# Patient Record
Sex: Female | Born: 1955 | Race: White | Hispanic: No | Marital: Married | Smoking: Never smoker
Health system: Southern US, Community
[De-identification: ages and names within clinical notes are randomized; demographics above are authoritative.]

## PROBLEM LIST (undated history)

## (undated) DIAGNOSIS — T8859XA Other complications of anesthesia, initial encounter: Secondary | ICD-10-CM

## (undated) DIAGNOSIS — Z9889 Other specified postprocedural states: Secondary | ICD-10-CM

---

## 1999-11-21 ENCOUNTER — Other Ambulatory Visit: Admission: RE | Admit: 1999-11-21 | Discharge: 1999-11-21 | Payer: Self-pay | Admitting: Family Medicine

## 2000-02-15 ENCOUNTER — Ambulatory Visit (HOSPITAL_COMMUNITY): Admission: RE | Admit: 2000-02-15 | Discharge: 2000-02-15 | Payer: Self-pay | Admitting: Gastroenterology

## 2000-05-07 ENCOUNTER — Ambulatory Visit (HOSPITAL_COMMUNITY): Admission: RE | Admit: 2000-05-07 | Discharge: 2000-05-07 | Payer: Self-pay | Admitting: Family Medicine

## 2000-12-05 ENCOUNTER — Other Ambulatory Visit: Admission: RE | Admit: 2000-12-05 | Discharge: 2000-12-05 | Payer: Self-pay | Admitting: Family Medicine

## 2002-03-19 ENCOUNTER — Other Ambulatory Visit: Admission: RE | Admit: 2002-03-19 | Discharge: 2002-03-19 | Payer: Self-pay | Admitting: Family Medicine

## 2003-04-17 HISTORY — PX: LASIK: SHX215

## 2003-04-21 ENCOUNTER — Other Ambulatory Visit: Admission: RE | Admit: 2003-04-21 | Discharge: 2003-04-21 | Payer: Self-pay | Admitting: Family Medicine

## 2005-06-22 ENCOUNTER — Ambulatory Visit (HOSPITAL_COMMUNITY): Admission: RE | Admit: 2005-06-22 | Discharge: 2005-06-22 | Payer: Self-pay | Admitting: Gastroenterology

## 2008-06-16 ENCOUNTER — Encounter: Payer: Self-pay | Admitting: Gynecology

## 2008-06-16 ENCOUNTER — Other Ambulatory Visit: Admission: RE | Admit: 2008-06-16 | Discharge: 2008-06-16 | Payer: Self-pay | Admitting: Gynecology

## 2008-06-16 ENCOUNTER — Ambulatory Visit: Payer: Self-pay | Admitting: Gynecology

## 2008-06-18 ENCOUNTER — Ambulatory Visit: Payer: Self-pay | Admitting: Gynecology

## 2009-06-21 ENCOUNTER — Other Ambulatory Visit: Admission: RE | Admit: 2009-06-21 | Discharge: 2009-06-21 | Payer: Self-pay | Admitting: Gynecology

## 2009-06-21 ENCOUNTER — Ambulatory Visit: Payer: Self-pay | Admitting: Gynecology

## 2009-08-07 ENCOUNTER — Emergency Department (HOSPITAL_COMMUNITY): Admission: EM | Admit: 2009-08-07 | Discharge: 2009-08-07 | Payer: Self-pay | Admitting: Family Medicine

## 2009-09-30 ENCOUNTER — Ambulatory Visit: Payer: Self-pay | Admitting: Family Medicine

## 2009-09-30 DIAGNOSIS — M545 Low back pain, unspecified: Secondary | ICD-10-CM | POA: Insufficient documentation

## 2010-05-16 NOTE — Assessment & Plan Note (Signed)
Summary: NP,HIP PAIN,MC   Vital Signs:  Patient profile:   55 year old female Height:      61 inches Weight:      117 pounds BMI:     22.19 BP sitting:   128 / 78  Vitals Entered By: Lillia Pauls CMA (September 30, 2009 8:49 AM)  History of Present Illness: Pt presents for right hip pain that she has had for one year which radiates down from her right lower back. She is an avid golfer who has been told that she swings fairly hard. She also plays tennis and is right handed. She usually carries her golf bag and walks the course. Her pain is usually worse at the end of golfing and is described as a throbbing pain. She has difficulty lying on her right side to sleep at night because of the pain. This pain though is located at her right lower back. No focal tenderness over her right hip. She has been icing the area and rarely takes advil which does help the pain. She denies any leg pain, numbness or tingling. No prior injuries to her back or hips.   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   Head:  normocephalic and atraumatic.   Ears:  Normal hearing Mouth:  MMM Neck:  supple and full ROM.   Lungs:  normal respiratory effort.   Msk:  Back: No scoliosis or edema + TTP over right lower back just below the mid iliac crest Full ROM with only slight pain at the above mentioned site but worse with back rotation Able to walk on her heels and toes Neg SLR bilaterally 5/5 strength with resisted knee flexion and extension as well as with resisted big toe extension 1 + DTRs of  bilaterall patellar tendons Squatting down low and leaning forward recreates pain FABER is mildly positive  Hips: Normal inspection Normal IR and ER bilaterally without resistance or crepitus 5/5 strength bilaterally with resisted hip flexion and abduction No trochanteric tenderness to palpation  Neurovascularly intact bilaterally   Impression & Recommendations:  Problem # 1:  BACK PAIN, LUMBAR (ICD-724.2) Assessment New  1. Given multiple exercises to do including using the Theraband for resisted hip extension and planks to do daily 2. Heat over back for 15 minutes followed by 15 minutes of icing 3. Can continue all activities as tolerated 4. Return in 4-6 weeks as needed 5. OTC NSAID scheduled for 4-5 days and then as needed  Orders: Theraband per yard [phone])  Patient Instructions: 1)  Do the exercises twice a day for five to ten minutes minutes 2)  Place a heating pack on your lower back for 10-15 minutes and then ice for an additional 15 minutes 3)  You can continue activities as tolerated 4)  We will see you back in 4-6 weeks if you are not getting better. If you are getting better, then we can just see you on an as needed basis in the future.

## 2010-06-28 ENCOUNTER — Other Ambulatory Visit: Payer: Self-pay | Admitting: Gynecology

## 2010-06-28 ENCOUNTER — Other Ambulatory Visit (HOSPITAL_COMMUNITY)
Admission: RE | Admit: 2010-06-28 | Discharge: 2010-06-28 | Disposition: A | Discharge status: Home / Self Care | Payer: 59 | Source: Ambulatory Visit | Attending: Gynecology | Admitting: Gynecology

## 2010-06-28 ENCOUNTER — Encounter (INDEPENDENT_AMBULATORY_CARE_PROVIDER_SITE_OTHER): Payer: 59 | Admitting: Gynecology

## 2010-06-28 DIAGNOSIS — Z124 Encounter for screening for malignant neoplasm of cervix: Secondary | ICD-10-CM | POA: Insufficient documentation

## 2010-06-28 DIAGNOSIS — Z01419 Encounter for gynecological examination (general) (routine) without abnormal findings: Secondary | ICD-10-CM

## 2010-09-01 NOTE — Op Note (Signed)
Dorothy [name], BOWIE             ACCOUNT NO.:  [id number]   MEDICAL RECORD NO.:  [id number]          PATIENT TYPE:  AMB   LOCATION:  ENDO                         FACILITY:  [facility]   PHYSICIAN:  Anselmo Rod, M.D.  DATE OF BIRTH:  Aug 31, 1955   DATE OF PROCEDURE:  06/22/2005  DATE OF DISCHARGE:                                 OPERATIVE REPORT   PROCEDURE PERFORMED:  Screening colonoscopy.   ENDOSCOPIST:  Anselmo Rod, MD.   INSTRUMENT USED:  Olympus video colonoscope.   INDICATIONS FOR PROCEDURE:  A 55 year old white female with a family history  of colon cancer now undergoing a colonoscopy to rule out colonic polyps,  masses, etc.   PRE-PROCEDURE PREPARATION:  Informed consent was procured from the patient.  The patient was fasted for 4 hours prior to the procedure and prepped with  OsmoPrep pills the night of and the morning of the procedure.  Risks and  benefits of the procedure including a 10% missed rate of cancer and polyps  were discussed with the patient as well.   PRE-PROCEDURE PHYSICAL EXAMINATION:  VITAL SIGNS:  Stable.  NECK:  Supple.  CHEST:  Clear to auscultation, S1 and S2 regular.  ABDOMEN:  Soft with normal bowel sounds.   DESCRIPTION OF PROCEDURE:  The patient was placed in the left lateral  decubitus position, sedated with 75 mcg of Fentanyl and 7.5 mg of Versed in  slow incremental doses.  Once the patient was adequately sedated and  maintained on low-flow oxygen and continuous cardiac monitoring, the Olympus  video colonoscope was advanced from the rectum to the cecum where the  appendiceal orifices and cecal valve were clearly visualized and  photographed.  No masses, polyps, erosions, ulcerations, or diverticula were  seen.  Retroflexion in the rectum revealed no abnormalities, and a small  external hemorrhoid was seen on anal inspection.  The patient tolerated the  procedure well without complications.   IMPRESSION:  Normal colonoscopy up to the  terminal ileum except for a small  external hemorrhoid seen on anal inspection.   RECOMMENDATIONS:  1.  Continue a high-fiber diet with liberal fluid intake.  2.  Repeat colonoscopy in the next 10 years unless the patient develops any      abnormal symptoms in the interim.  3.  Outpatient followup as need arises in the future.      Anselmo Rod, M.D.  Electronically Signed     JNM/MEDQ  D:  06/22/2005  T:  06/22/2005  Job:  [id number]   cc:   Gaspar Garbe, M.D.  Fax: [phone]

## 2010-09-01 NOTE — Procedures (Signed)
[facility]  Patient:    Dorothy [name], Dorothy [name]                    MRN: [id number] Proc. Date: 05/07/00 Adm. Date:  [id number] Attending:  Charna Elizabeth CC:         Talmadge Coventry, M.D.                           Procedure Report  DATE OF BIRTH:  03-Apr-1956.  REFERRING PHYSICIAN:  Talmadge Coventry, M.D.  PROCEDURE PERFORMED:  Colonoscopy.  ENDOSCOPIST:  Anselmo Rod, M.D.  INSTRUMENT USED:  Olympus video colonoscope.  INDICATIONS FOR PROCEDURE:  Rectal bleeding in a 55 year old white female with a family history of colon cancer, rule out colonic polyps, masses, hemorrhoids, etc.  PREPROCEDURE PREPARATION:  Informed consent was procured from the patient. The patient was fasted for eight hours prior to the procedure and prepped with a bottle of magnesium citrate and a gallon of NuLytely the night prior to the procedure.  PREPROCEDURE PHYSICAL:  The patient had stable vital signs.  Neck supple. Chest clear to auscultation.  S1, S2 regular.  Abdomen soft with normal abdominal bowel sounds.  Cesarean section scar is present.  DESCRIPTION OF PROCEDURE:  The patient was placed in the left lateral decubitus position and sedated with 100 mg of Demerol and 10 mg of Versed intravenously.  Once the patient was adequately sedated and maintained on low-flow oxygen and continuous cardiac monitoring, the Olympus video colonoscope was advanced from the rectum to the cecum with slight difficulty secondary to a very tortuous colon.  The adult colonoscope was changed to a pediatric colonoscope and the procedure was complete up to the cecum.  The appendicular orifice and ileocecal valve were clearly visualized.  Small nonbleeding internal hemorrhoids were seen on retroflexion in the rectum.  IMPRESSION:  Normal colonoscopy except for small nonbleeding internal hemorrhoids.  RECOMMENDATIONS: 1. The patient has been advised to increase the fluid and fiber  in her diet    and maintain bowel regularity. 2. Outpatient follow-up is advised on a p.r.n. basis. 3. Repeat colorectal exam screening has been recommended in the next five    years or earlier if the patient were to develop any abnormal symptoms in    the interim.  DD:  05/07/00 TD:  05/07/00 Job: [id number]

## 2011-07-05 ENCOUNTER — Encounter: Payer: Self-pay | Admitting: Gynecology

## 2011-07-05 ENCOUNTER — Encounter: Payer: 59 | Admitting: Gynecology

## 2011-07-05 ENCOUNTER — Ambulatory Visit (INDEPENDENT_AMBULATORY_CARE_PROVIDER_SITE_OTHER): Payer: 59 | Admitting: Gynecology

## 2011-07-05 VITALS — BP 120/70 | Ht 61.5 in | Wt 122.0 lb

## 2011-07-05 DIAGNOSIS — Z01419 Encounter for gynecological examination (general) (routine) without abnormal findings: Secondary | ICD-10-CM

## 2011-07-05 DIAGNOSIS — IMO0002 Reserved for concepts with insufficient information to code with codable children: Secondary | ICD-10-CM

## 2011-07-05 MED ORDER — ESTRADIOL 10 MCG VA TABS: 1.0000 | ORAL_TABLET | VAGINAL | Status: DC

## 2011-07-05 NOTE — Patient Instructions (Signed)
Follow up in one year for her annual gynecologic exam. 

## 2011-07-05 NOTE — Progress Notes (Signed)
Dorothy [name] 25-Dec-1955 [id number]        56 y.o.  for annual exam.  Doing well. Using Vagifem for vaginal atrophy and dyspareunia this is working well for her.  Past medical history,surgical history, medications, allergies, family history and social history were all reviewed and documented in the EPIC chart. ROS:  Was performed and pertinent positives and negatives are included in the history.  Exam: Sherrilyn Rist chaperone present Filed Vitals:   07/05/11 1101  BP: 120/70   General appearance  Normal Skin grossly normal Head/Neck normal with no cervical or supraclavicular adenopathy thyroid normal Lungs  clear Cardiac RR, without RMG Abdominal  soft, nontender, without masses, organomegaly or hernia Breasts  examined lying and sitting without masses, retractions, discharge or axillary adenopathy. Pelvic  Ext/BUS/vagina  normal with mild atrophic changes  Cervix  normal    Uterus  anteverted, normal size, shape and contour, midline and mobile nontender   Adnexa  Without masses or tenderness    Anus and perineum  normal   Rectovaginal  normal sphincter tone without palpated masses or tenderness.    Assessment/Plan:  56 y.o. female for annual exam.    1. Atrophic vaginitis/dyspareunia. She is using Vagifem doing well with this wants to continue. She is using it 2-3 times weekly. We have had a discussion about WHI study risks of absorption stroke heart attack DVT endometrial stimulation discussion. She's had no bleeding and will continue to monitor. I refilled her times a year. 2. Pap smear. No Pap smear was done today. Her last Pap smear was 2012. She has 3 annual normal reports and her chart has no history of abnormal Pap smears before. Discussed current screening guidelines we'll plan on every 3 year Pap smears. 3. Mammography. Patient had her mammogram January which was normal. She'll continue with annual mammography. SBE monthly reviewed. 4. Bone density. Patient had bone density  through her primary office last year which was reported normal. Increased calcium the reviewed. 5. Colonoscopy. Patient is up-to-date with colonoscopy. 6. Health maintenance. Her blood work was done this is all done through her primary physician's office who she sees on a routine basis. Assuming she continues well from a gynecologic standpoint she will see [place] in a year, sooner as needed.    Dara Lords MD, 11:53 AM 07/05/2011

## 2012-07-08 ENCOUNTER — Other Ambulatory Visit: Payer: Self-pay | Admitting: Family Medicine

## 2012-07-08 ENCOUNTER — Other Ambulatory Visit (HOSPITAL_COMMUNITY)
Admission: RE | Admit: 2012-07-08 | Discharge: 2012-07-08 | Disposition: A | Discharge status: Home / Self Care | Payer: 59 | Source: Ambulatory Visit | Attending: Family Medicine | Admitting: Family Medicine

## 2012-07-08 DIAGNOSIS — Z0000 Encounter for general adult medical examination without abnormal findings: Secondary | ICD-10-CM | POA: Insufficient documentation

## 2013-02-19 ENCOUNTER — Other Ambulatory Visit: Payer: Self-pay | Admitting: Gynecology

## 2013-04-27 ENCOUNTER — Encounter: Payer: Self-pay | Admitting: Family Medicine

## 2013-04-27 ENCOUNTER — Ambulatory Visit (INDEPENDENT_AMBULATORY_CARE_PROVIDER_SITE_OTHER): Payer: 59 | Admitting: Family Medicine

## 2013-04-27 VITALS — BP 126/60 | HR 68 | Temp 97.0°F | Resp 16 | Wt 121.1 lb

## 2013-04-27 DIAGNOSIS — IMO0002 Reserved for concepts with insufficient information to code with codable children: Secondary | ICD-10-CM

## 2013-04-27 DIAGNOSIS — M2170 Unequal limb length (acquired), unspecified site: Secondary | ICD-10-CM

## 2013-04-27 DIAGNOSIS — S76011A Strain of muscle, fascia and tendon of right hip, initial encounter: Secondary | ICD-10-CM

## 2013-04-27 DIAGNOSIS — M999 Biomechanical lesion, unspecified: Secondary | ICD-10-CM

## 2013-04-27 DIAGNOSIS — M533 Sacrococcygeal disorders, not elsewhere classified: Secondary | ICD-10-CM

## 2013-04-27 NOTE — Assessment & Plan Note (Signed)
Left shorter by 1 cm heel lift given today.

## 2013-04-27 NOTE — Progress Notes (Signed)
CC: Right hip pain  HPI: Patient is a 58 year old female coming in with a complaint of right hip pain. She states that she has had this pain for years. Patient does not remember any injury. 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 Patient points the pain mostly been over the right SI joint. Patient states it does seem to radiate around somewhat but no significant radiation down the leg, any numbness or weakness of the lower extremity. Patient describes as more of a dull aching pain that has responded to different interventions including chiropractor, acupuncture, and she worked with a trainer twice a week but the pain never completely resolves. Patient states that she has more discomfort when she is laying on that right side at night it does wake her up. Patient had a friend who has come here for osteopathic manipulation with good results and would like the potential evaluation.  Past medical, surgical, family and social history reviewed. Medications reviewed all in the electronic medical record.   Review of Systems: No headache, visual changes, nausea, vomiting, diarrhea, constipation, dizziness, abdominal pain, skin rash, fevers, chills, night sweats, weight loss, swollen lymph nodes, body aches, joint swelling, muscle aches, chest pain, shortness of breath, mood changes.   Objective:    Blood pressure 126/60, pulse 68, temperature 97 F (36.1 C), temperature source Oral, resp. rate 16, weight 121 lb 1.3 oz (54.922 kg), last menstrual period 04/16/2008, SpO2 98.00%.   General: No apparent distress alert and oriented x3 mood and affect normal, dressed appropriately.  HEENT: Pupils equal, extraocular movements intact Respiratory: Patient's speak in full sentences and does not appear short of breath Cardiovascular: No lower extremity edema, non tender, no erythema Skin: Warm dry intact with no signs of infection or rash on extremities or on axial  skeleton. Abdomen: Soft nontender Neuro: Cranial nerves II through XII are intact, neurovascularly intact in all extremities with 2+ DTRs and 2+ pulses. Lymph: No lymphadenopathy of posterior or anterior cervical chain or axillae bilaterally.  Gait normal with good balance and coordination.  MSK: Non tender with full range of motion and good stability and symmetric strength and tone of shoulders, elbows, wrist,  knee and ankles bilaterally.  Back Exam:  Inspection: Unremarkable  Motion: Flexion 45 deg, Extension 45 deg, Side Bending to 45 deg bilaterally,  Rotation to 45 deg bilaterally  SLR laying: Negative  XSLR laying: Negative  Palpable tenderness: None. FABER: negative. Sensory change: Gross sensation intact to all lumbar and sacral dermatomes.  Reflexes: 2+ at both patellar tendons, 2+ at achilles tendons, Babinski's downgoing.  Strength at foot  Plantar-flexion: 5/5 Dorsi-flexion: 5/5 Eversion: 5/5 Inversion: 5/5  Leg strength  Quad: 5/5 Hamstring: 5/5 Hip flexor: 5/5 Hip abductors: 5/5  Gait unremarkable.  Hip: ROM IR: 35 Deg, ER: 45 Deg, Flexion: 120 Deg, Extension: 100 Deg, Abduction: 45 Deg, Adduction: 45 Deg Strength IR: 5/5, ER: 5/5, Flexion: 5/5, Extension: 5/5, Abduction: 5/5, Adduction: 5/5 Pelvic alignment unremarkable to inspection and palpation. Standing hip rotation and gait without trendelenburg sign / unsteadiness. Greater trochanter without tenderness to palpation. No tenderness over piriformis and greater trochanter. No pain with FABER or FADIR. Patient's right SI joint is severely tender Contralateral hip unremarkable  OMT Physical Exam  Standing structural       Patient does have a leg length discrepancy with the left leg being 1cm short  Standing flexion right  Seated Flexion right  Cervical  Neutral Thoracic T1  extended rotated and side bent left with elevated first ray T3 extended rotated and side bent left T5 extended rotated inside that  right  Lumbar L2 flexed rotated inside that right  Sacrum Right on right  Illium Right anterior ilium     Impression and Recommendations:     This case required medical decision making of moderate complexity.

## 2013-04-27 NOTE — Assessment & Plan Note (Signed)
.   The discussed findings with patient and given different treatment options. Patient did respond to osteopathic manipulation. Home exercise program given to work on muscle imbalances Discussed over-the-counter medications that can be beneficial. Discussed icing Patient will follow up again in 2 weeks for further manipulation.

## 2013-04-27 NOTE — Assessment & Plan Note (Signed)
Specific home exercises were given. Leg length was diagnosed and given a heel lift Patient will come back again in 2 weeks to make sure that she continues to improve.

## 2013-04-27 NOTE — Assessment & Plan Note (Signed)
Decision today to treat with OMT was based on Physical Exam  After verbal consent patient was treated with HVLA, ME techniques in thoracic, lumbar and sacral areas  Patient tolerated the procedure well with improvement in symptoms  Patient given exercises, stretches and lifestyle modifications  See medications in patient instructions if given  Patient will follow up in 2weeks 

## 2013-04-27 NOTE — Assessment & Plan Note (Signed)
Decision today to treat with OMT was based on Physical Exam  After verbal consent patient was treated with HVLA, ME techniques in thoracic, lumbar and sacral areas  Patient tolerated the procedure well with improvement in symptoms  Patient given exercises, stretches and lifestyle modifications  See medications in patient instructions if given  Patient will follow up in 2weeks

## 2013-04-27 NOTE — Patient Instructions (Signed)
Very nice to meet you Tell susan Hi Try exercises most days of the week Wear heel lift in left shoe Sacroiliac Joint Mobilization and Rehab 1. Work on pretzel stretching, shoulder back and leg draped in front. 3-5 sets, 30 sec.. 2. hip abductor rotations. standing, hip flexion and rotation outward then inward. 3 sets, 15 reps. when can do comfortably, add ankle weights starting at 2 pounds.  3. cross over stretching - shoulder back to ground, same side leg crossover. 3-5 sets for 30 min..  4. rolling up and back knees to chest and rocking. 5. sacral tilt - 5 sets, hold for 5-10 seconds Also look at handout for hip flexor Fish oil 3 grams daily Tumeric 500mg  twice daily.  Ice 20 minutes in the area after activity.  Come back again in 2 weeks.

## 2013-05-11 ENCOUNTER — Ambulatory Visit (INDEPENDENT_AMBULATORY_CARE_PROVIDER_SITE_OTHER): Payer: 59 | Admitting: Family Medicine

## 2013-05-11 ENCOUNTER — Encounter: Payer: Self-pay | Admitting: Family Medicine

## 2013-05-11 VITALS — BP 126/62 | HR 71 | Temp 97.8°F | Resp 16 | Wt 124.1 lb

## 2013-05-11 DIAGNOSIS — M999 Biomechanical lesion, unspecified: Secondary | ICD-10-CM

## 2013-05-11 DIAGNOSIS — M9981 Other biomechanical lesions of cervical region: Secondary | ICD-10-CM

## 2013-05-11 DIAGNOSIS — M533 Sacrococcygeal disorders, not elsewhere classified: Secondary | ICD-10-CM

## 2013-05-11 NOTE — Assessment & Plan Note (Signed)
Decision today to treat with OMT was based on Physical Exam  After verbal consent patient was treated with HVLA, ME techniques in cervical, thoracic, lumbar and sacral areas  Patient tolerated the procedure well with improvement in symptoms  Patient given exercises, stretches and lifestyle modifications, discussed lifting and then potentially new bag with travel.   See medications in patient instructions if given  Patient will follow up in 2weeks

## 2013-05-11 NOTE — Assessment & Plan Note (Signed)
Discussed again at great length about wearing the heel lift which would be helpful. We did show patient proper technique to simply exercises that was given to her again. Patient will try these and come back again in 2-3 weeks for further evaluation and treatment. Patient is traveling and we discussed proper lifting mechanics which could be beneficial as well.

## 2013-05-11 NOTE — Assessment & Plan Note (Signed)
Decision today to treat with OMT was based on Physical Exam  After verbal consent patient was treated with HVLA, ME techniques in thoracic, lumbar and sacral areas  Patient tolerated the procedure well with improvement in symptoms  Patient given exercises, stretches and lifestyle modifications  See medications in patient instructions if given  Patient will follow up in 2weeks 

## 2013-05-11 NOTE — Patient Instructions (Signed)
Great to see you Continue the exercises  Lifting anything heavy (greater then 20 pounds use two hands.  Consider getting a new bag.  See you again in 2 weeks.

## 2013-05-11 NOTE — Progress Notes (Signed)
  CC: Follow up for SI joint and manipulation.   HPI: Patient is seen 2 weeks ago and was diagnosed with sacroiliac joint dysfunction. Patient did respond well to osteopathic manipulation. Patient states that she may be approximately 5-10% better overall. Patient has been doing the exercises without any significant discomfort. Patient has been traveling recently and states that that seems to have aggravated the area somewhat. Patient is here for further manipulation. Patient denies any new symptoms. Patient has been working on sleeping position which has been helpful. Patient is wearing the heel lift and has been helpful but is not switching him to every type of shoe.  Past medical, surgical, family and social history reviewed. Medications reviewed all in the electronic medical record.   Review of Systems: No headache, visual changes, nausea, vomiting, diarrhea, constipation, dizziness, abdominal pain, skin rash, fevers, chills, night sweats, weight loss, swollen lymph nodes, body aches, joint swelling, muscle aches, chest pain, shortness of breath, mood changes.   Objective:    Blood pressure 126/62, pulse 71, temperature 97.8 F (36.6 C), temperature source Oral, resp. rate 16, weight 124 lb 1.4 oz (56.287 kg), last menstrual period 04/16/2008, SpO2 99.00%.   General: No apparent distress alert and oriented x3 mood and affect normal, dressed appropriately.  HEENT: Pupils equal, extraocular movements intact Respiratory: Patient's speak in full sentences and does not appear short of breath Cardiovascular: No lower extremity edema, non tender, no erythema Skin: Warm dry intact with no signs of infection or rash on extremities or on axial skeleton. Abdomen: Soft nontender Neuro: Cranial nerves II through XII are intact, neurovascularly intact in all extremities with 2+ DTRs and 2+ pulses. Lymph: No lymphadenopathy of posterior or anterior cervical chain or axillae bilaterally.  Gait normal with  good balance and coordination.  MSK: Non tender with full range of motion and good stability and symmetric strength and tone of shoulders, elbows, wrist,  knee and ankles bilaterally.  Hip: ROM IR: 35 Deg, ER: 45 Deg, Flexion: 120 Deg, Extension: 100 Deg, Abduction: 45 Deg, Adduction: 45 Deg Strength IR: 5/5, ER: 5/5, Flexion: 5/5, Extension: 5/5, Abduction: 5/5, Adduction: 5/5 Pelvic alignment unremarkable to inspection and palpation. Standing hip rotation and gait without trendelenburg sign / unsteadiness. Greater trochanter without tenderness to palpation. No tenderness over piriformis and greater trochanter. No pain with FABER or FADIR. Patient's right SI joint is severely tender Contralateral hip unremarkable  OMT Physical Exam    Cervical  C4 flexed rotated and side bent right  Thoracic T3 extended rotated and side bent left T5 extended rotated inside that right  Lumbar L2 flexed rotated inside that right  Sacrum Right on right  Illium Neutral    Impression and Recommendations:     This case required medical decision making of moderate complexity.

## 2013-05-25 ENCOUNTER — Encounter: Payer: Self-pay | Admitting: Family Medicine

## 2013-05-25 ENCOUNTER — Ambulatory Visit (INDEPENDENT_AMBULATORY_CARE_PROVIDER_SITE_OTHER): Payer: 59 | Admitting: Family Medicine

## 2013-05-25 VITALS — BP 112/68 | HR 66 | Temp 98.4°F | Resp 16 | Wt 123.1 lb

## 2013-05-25 DIAGNOSIS — M9981 Other biomechanical lesions of cervical region: Secondary | ICD-10-CM

## 2013-05-25 DIAGNOSIS — G5701 Lesion of sciatic nerve, right lower limb: Secondary | ICD-10-CM | POA: Insufficient documentation

## 2013-05-25 DIAGNOSIS — M999 Biomechanical lesion, unspecified: Secondary | ICD-10-CM

## 2013-05-25 DIAGNOSIS — G5700 Lesion of sciatic nerve, unspecified lower limb: Secondary | ICD-10-CM

## 2013-05-25 DIAGNOSIS — M533 Sacrococcygeal disorders, not elsewhere classified: Secondary | ICD-10-CM

## 2013-05-25 NOTE — Progress Notes (Signed)
  CC: Follow up for SI joint and manipulation.   HPI: Patient is seen 4 weeks ago for Si manipulation. Patient states she is another 10% better. Patient did travel recently and stated that she did have some mild discomfort. Patient though was able to play golf the other day and did do very well. Patient denies any new symptoms. Patient is still encourage that osteopathic manipulation is helping. Past medical, surgical, family and social history reviewed. Medications reviewed all in the electronic medical record.   Review of Systems: No headache, visual changes, nausea, vomiting, diarrhea, constipation, dizziness, abdominal pain, skin rash, fevers, chills, night sweats, weight loss, swollen lymph nodes, body aches, joint swelling, muscle aches, chest pain, shortness of breath, mood changes.   Objective:    Blood pressure 112/68, pulse 66, temperature 98.4 F (36.9 C), temperature source Oral, resp. rate 16, weight 123 lb 1.3 oz (55.829 kg), last menstrual period 04/16/2008, SpO2 92.00%.   General: No apparent distress alert and oriented x3 mood and affect normal, dressed appropriately.  HEENT: Pupils equal, extraocular movements intact Respiratory: Patient's speak in full sentences and does not appear short of breath Cardiovascular: No lower extremity edema, non tender, no erythema Skin: Warm dry intact with no signs of infection or rash on extremities or on axial skeleton. Abdomen: Soft nontender Neuro: Cranial nerves II through XII are intact, neurovascularly intact in all extremities with 2+ DTRs and 2+ pulses. Lymph: No lymphadenopathy of posterior or anterior cervical chain or axillae bilaterally.  Gait normal with good balance and coordination.  MSK: Non tender with full range of motion and good stability and symmetric strength and tone of shoulders, elbows, wrist,  knee and ankles bilaterally.  Hip: ROM IR: 35 Deg, ER: 45 Deg, Flexion: 120 Deg, Extension: 100 Deg, Abduction: 45 Deg,  Adduction: 45 Deg Strength IR: 5/5, ER: 5/5, Flexion: 5/5, Extension: 5/5, Abduction: 5/5, Adduction: 5/5 Pelvic alignment unremarkable to inspection and palpation. Standing hip rotation and gait without trendelenburg sign / unsteadiness. Greater trochanter without tenderness to palpation. No tenderness over greater trochanter. No pain with  FADIR. Mild pain with Corky Sox testing is minimally tender to palpation over the right piriformis Patient's right SI joint is severely tender Contralateral hip unremarkable  OMT Physical Exam    Cervical  C4 flexed rotated and side bent right  Thoracic T3 extended rotated and side bent left T5 extended rotated inside that right  Lumbar L2 flexed rotated inside that right  Sacrum Right on right  Illium Neutral    Impression and Recommendations:     This case required medical decision making of moderate complexity.

## 2013-05-25 NOTE — Progress Notes (Signed)
Pre-visit discussion using our clinic review tool. No additional management support is needed unless otherwise documented below in the visit note.  

## 2013-05-25 NOTE — Assessment & Plan Note (Signed)
Patient is having more of a piriformis syndrome. Patient was given a home exercise program and we discussed strengthening of hip abductor's. Patient will try these changes and come back again in 4 weeks. Hopefully patient will continue to respond to osteopathic manipulation.

## 2013-05-25 NOTE — Assessment & Plan Note (Signed)
Decision today to treat with OMT was based on Physical Exam  After verbal consent patient was treated with HVLA, ME techniques in cervical, thoracic, lumbar and sacral areas  Patient tolerated the procedure well with improvement in symptoms  Patient given exercises, stretches and lifestyle modifications, discussed lifting and then potentially new bag with travel.   See medications in patient instructions if given  Patient will follow up in 4weeks

## 2013-05-25 NOTE — Patient Instructions (Signed)
Good to see you Try changing the one on your back with opposite leg straight. Preztel stretch with the leg straight.  The hip abductors on side, lift leg 6 inches hold 2 seconds, down slow for count of 4 repeat 1 set of 30 daily each side first week, then 2 sets daily for a week then 3 sets daily thereafter.  Hip abductor weakness.  Continue everything else and look over info on piriformis syndrome Come back in 4 weeks.

## 2013-05-25 NOTE — Assessment & Plan Note (Signed)
Patient's is making some improvement but it is slow. Patient will start doing other core exercises as well as the hip abductor strengthening. Patient has responded osteopathic manipulation and will come back again in 4 weeks.

## 2013-05-25 NOTE — Assessment & Plan Note (Signed)
Decision today to treat with OMT was based on Physical Exam  After verbal consent patient was treated with HVLA, ME techniques in cervical, thoracic, lumbar and sacral areas  Patient tolerated the procedure well with improvement in symptoms  Patient given exercises, stretches and lifestyle modifications, discussed lifting and then potentially new bag with travel.   See medications in patient instructions if given  Patient will follow up in 4weeks    

## 2013-06-24 ENCOUNTER — Ambulatory Visit (INDEPENDENT_AMBULATORY_CARE_PROVIDER_SITE_OTHER): Payer: 59 | Admitting: Family Medicine

## 2013-06-24 ENCOUNTER — Other Ambulatory Visit (INDEPENDENT_AMBULATORY_CARE_PROVIDER_SITE_OTHER): Payer: 59

## 2013-06-24 ENCOUNTER — Encounter: Payer: Self-pay | Admitting: Family Medicine

## 2013-06-24 VITALS — BP 120/68 | HR 78 | Temp 97.6°F | Resp 16 | Wt 123.0 lb

## 2013-06-24 DIAGNOSIS — G5700 Lesion of sciatic nerve, unspecified lower limb: Secondary | ICD-10-CM

## 2013-06-24 DIAGNOSIS — M7061 Trochanteric bursitis, right hip: Secondary | ICD-10-CM

## 2013-06-24 DIAGNOSIS — G5701 Lesion of sciatic nerve, right lower limb: Secondary | ICD-10-CM

## 2013-06-24 DIAGNOSIS — M76899 Other specified enthesopathies of unspecified lower limb, excluding foot: Secondary | ICD-10-CM

## 2013-06-24 NOTE — Assessment & Plan Note (Signed)
Injection given today with a most complete resolution of pain. Patient was given home exercises and we augmented patient's previous exercises. We discussed icing protocol and over-the-counter medications that could be beneficial. Patient will follow up again in 3-4 weeks.

## 2013-06-24 NOTE — Progress Notes (Signed)
CC: Follow up for SI joint and manipulation.   HPI: Patient states she continues to be in about the same amount of pain. This seems to be localized mostly over the right lateral aspect of the hip now. Patient states it hurts when she lays on her side and has made her golf game terrible. Patient denies any radiation to make any numbness or any weakness. Patient continues the exercises without any significant improvement. Patient has been tried over-the-counter medications with minimal improvement.  Review of Systems: No headache, visual changes, nausea, vomiting, diarrhea, constipation, dizziness, abdominal pain, skin rash, fevers, chills, night sweats, weight loss, swollen lymph nodes, body aches, joint swelling, muscle aches, chest pain, shortness of breath, mood changes.   Objective:    Blood pressure 120/68, pulse 78, temperature 97.6 F (36.4 C), temperature source Oral, resp. rate 16, weight 123 lb (55.792 kg), last menstrual period 04/16/2008, SpO2 99.00%.   General: No apparent distress alert and oriented x3 mood and affect normal, dressed appropriately.  HEENT: Pupils equal, extraocular movements intact Respiratory: Patient's speak in full sentences and does not appear short of breath Cardiovascular: No lower extremity edema, non tender, no erythema Skin: Warm dry intact with no signs of infection or rash on extremities or on axial skeleton. Abdomen: Soft nontender Neuro: Cranial nerves II through XII are intact, neurovascularly intact in all extremities with 2+ DTRs and 2+ pulses. Lymph: No lymphadenopathy of posterior or anterior cervical chain or axillae bilaterally.  Gait normal with good balance and coordination.  MSK: Non tender with full range of motion and good stability and symmetric strength and tone of shoulders, elbows, wrist,  knee and ankles bilaterally.  Hip: ROM IR: 35 Deg, ER: 45 Deg, Flexion: 120 Deg, Extension: 100 Deg, Abduction: 45 Deg, Adduction: 45  Deg Strength IR: 5/5, ER: 5/5, Flexion: 5/5, Extension: 5/5, Abduction: 5/5, Adduction: 5/5 Pelvic alignment unremarkable to inspection and palpation. Standing hip rotation and gait without trendelenburg sign / unsteadiness. Greater trochanter with significant tenderness. No tenderness over greater trochanter. No pain with  FADIR. Mild pain with Corky Sox testing is minimally tender to palpation over the right piriformis Patient's right SI joint is severely tender Contralateral hip unremarkable  MSK US performed of: Right This study was ordered, performed, and interpreted by Charlann Boxer D.O.  Hip: Trochanteric bursa with moderate effusion Acetabular labrum visualized and without tears, displacement, or effusion in joint. Femoral neck appears unremarkable without increased power doppler signal along Cortex.  IMPRESSION:  Greater trochanteric bursitis    Procedure: Real-time Ultrasound Guided Injection of right greater trochanteric bursitis secondary to patient's body habitus Device: GE Logiq E  Ultrasound guided injection is preferred based studies that show increased duration, increased effect, greater accuracy, decreased procedural pain, increased response rate, and decreased cost with ultrasound guided versus blind injection.  Verbal informed consent obtained.  Time-out conducted.  Noted no overlying erythema, induration, or other signs of local infection.  Skin prepped in a sterile fashion.  Local anesthesia: Topical Ethyl chloride.  With sterile technique and under real time ultrasound guidance:  Greater trochanteric area was visualized and patient's bursa was noted. A 22-gauge 3 inch needle was inserted and 4 cc of 0.5% Marcaine and 1 cc of Kenalog 40 mg/dL was injected. Pictures taken Completed without difficulty  Pain immediately resolved suggesting accurate placement of the medication.  Advised to call if fevers/chills, erythema, induration, drainage, or persistent bleeding.   Images permanently stored and available for review in the ultrasound unit.  Impression:  Technically successful ultrasound guided injection.   Impression and Recommendations:     This case required medical decision making of moderate complexity. Spent greater than 25 minutes with patient face-to-face and had greater than 50% of counseling including as described above in assessment and plan.

## 2013-06-24 NOTE — Progress Notes (Signed)
Pre visit review using our clinic review tool, if applicable. No additional management support is needed unless otherwise documented below in the visit note. 

## 2013-06-24 NOTE — Patient Instructions (Signed)
You are doing great  Exercises still but giving new handout Now do hip abductors on the wall, heel and butt on wall lift 6-8 inches hold 2 seconds come down slow for count of 4. 30 reps daily 1 set first week, 2 sets 2nd week 3 sets thereafter.,  Come back again in 4 weeks if not perfect or need manipulation.

## 2013-06-29 ENCOUNTER — Telehealth: Payer: Self-pay | Admitting: Family Medicine

## 2013-06-29 NOTE — Telephone Encounter (Signed)
Pt calling for appt for pain in hip. Already made an appt for today. Did not want to hold anymore. Triage offered and declined. PT appt for 06/30/13 at 1445.

## 2013-06-30 ENCOUNTER — Ambulatory Visit (INDEPENDENT_AMBULATORY_CARE_PROVIDER_SITE_OTHER): Payer: 59 | Admitting: Family Medicine

## 2013-06-30 ENCOUNTER — Encounter: Payer: Self-pay | Admitting: Family Medicine

## 2013-06-30 VITALS — BP 142/86 | HR 74

## 2013-06-30 DIAGNOSIS — M999 Biomechanical lesion, unspecified: Secondary | ICD-10-CM

## 2013-06-30 DIAGNOSIS — M533 Sacrococcygeal disorders, not elsewhere classified: Secondary | ICD-10-CM

## 2013-06-30 MED ORDER — PREDNISONE 20 MG PO TABS: 40.0000 mg | ORAL_TABLET | Freq: Every day | ORAL | Status: DC

## 2013-06-30 NOTE — Progress Notes (Signed)
  CC: Follow up for SI joint and manipulation.   HPI: Patient was seen previously and diagnosed with GT and had injection and had no improvement. Patient actually states that her pain has been partially worsened since last visit. Patient is finding more pain even with extension. Patient with the pain being more posterior to the greater trochanteric area and radiating towards her right SI joint. Patient denies any radiation of the leg or any numbness but finds it difficult to sit long amount of time or even walk long amount of distance. Patient has not been doing any exercises secondary to pain.  Review of Systems: No headache, visual changes, nausea, vomiting, diarrhea, constipation, dizziness, abdominal pain, skin rash, fevers, chills, night sweats, weight loss, swollen lymph nodes, body aches, joint swelling, muscle aches, chest pain, shortness of breath, mood changes.   Objective:    Blood pressure 142/86, pulse 74, last menstrual period 04/16/2008, SpO2 98.00%.   General: No apparent distress alert and oriented x3 mood and affect normal, dressed appropriately.  HEENT: Pupils equal, extraocular movements intact Respiratory: Patient's speak in full sentences and does not appear short of breath Cardiovascular: No lower extremity edema, non tender, no erythema Skin: Warm dry intact with no signs of infection or rash on extremities or on axial skeleton. Abdomen: Soft nontender Neuro: Cranial nerves II through XII are intact, neurovascularly intact in all extremities with 2+ DTRs and 2+ pulses. Lymph: No lymphadenopathy of posterior or anterior cervical chain or axillae bilaterally.  Gait normal with good balance and coordination.  MSK: Non tender with full range of motion and good stability and symmetric strength and tone of shoulders, elbows, wrist,  knee and ankles bilaterally.  Hip: ROM IR: 35 Deg, ER: 45 Deg, Flexion: 120 Deg, Extension: 100 Deg, Abduction: 45 Deg, Adduction: 45  Deg Strength IR: 5/5, ER: 5/5, Flexion: 5/5, Extension: 5/5, Abduction: 5/5, Adduction: 5/5 Pelvic alignment unremarkable to inspection and palpation. Standing hip rotation and gait without trendelenburg sign / unsteadiness. Trochanteric bursa nontender No tenderness over greater trochanter. No pain with  FADIR. Mild pain with Corky Sox  Patient's right SI joint is severely tender Contralateral hip unremarkable  Osteopathic findings Lumbar L2 flexed rotated and side bent right L4 flexed rotated and side bent left  Sacrum Right on right  Impression and Recommendations:     This case required medical decision making of moderate complexity. Spent greater than 25 minutes with patient face-to-face and had greater than 50% of counseling including as described above in assessment and plan.

## 2013-06-30 NOTE — Patient Instructions (Signed)
Good to see you and sorry you are in pain.  Try prednisone daily for the next 5 days Take 2 days off stretches then 3 times a week  Trainer avoid too much lower back Come back again in 2 weeks.

## 2013-06-30 NOTE — Assessment & Plan Note (Signed)
Decision today to treat with OMT was based on Physical Exam  After verbal consent patient was treated with HVLA  techniques in lumbar and sacral areas  Patient tolerated the procedure well with improvement in symptoms  Patient given exercises, stretches and lifestyle modifications  See medications in patient instructions if given  Patient will follow up in 2 weeks

## 2013-06-30 NOTE — Assessment & Plan Note (Signed)
Patient sacrum is significantly tender and did have a good response after osteopathic manipulation today. I think there is a possibility for a ligamentous injury such as an iliolumbar ligament. Patient told to decrease the amount of stitches at this time. Patient will also do prednisone daily for the next 5 days. Patient will come back again sooner than 2 weeks for further evaluation and osteopathic manipulation.

## 2013-07-10 ENCOUNTER — Other Ambulatory Visit: Payer: Self-pay | Admitting: Family Medicine

## 2013-07-10 ENCOUNTER — Other Ambulatory Visit (HOSPITAL_COMMUNITY)
Admission: RE | Admit: 2013-07-10 | Discharge: 2013-07-10 | Disposition: A | Discharge status: Home / Self Care | Payer: 59 | Source: Ambulatory Visit | Attending: Family Medicine | Admitting: Family Medicine

## 2013-07-10 DIAGNOSIS — E349 Endocrine disorder, unspecified: Secondary | ICD-10-CM

## 2013-07-10 DIAGNOSIS — Z0000 Encounter for general adult medical examination without abnormal findings: Secondary | ICD-10-CM | POA: Insufficient documentation

## 2013-07-13 ENCOUNTER — Ambulatory Visit: Payer: 59 | Admitting: Family Medicine

## 2013-07-17 ENCOUNTER — Ambulatory Visit
Admission: RE | Admit: 2013-07-17 | Discharge: 2013-07-17 | Disposition: A | Discharge status: Home / Self Care | Payer: 59 | Source: Ambulatory Visit | Attending: Family Medicine | Admitting: Family Medicine

## 2013-07-17 DIAGNOSIS — E349 Endocrine disorder, unspecified: Secondary | ICD-10-CM

## 2013-07-17 IMAGING — US US PELVIS COMPLETE
1 series · 14 of 25 positions shown · non-contrast
Comparison: None

CLINICAL DATA: Hormone imbalance.

EXAM:
TRANSABDOMINAL AND TRANSVAGINAL ULTRASOUND OF PELVIS
TECHNIQUE: Both transabdominal and transvaginal ultrasound examinations of the
pelvis were performed. Transabdominal technique was performed for
global imaging of the pelvis including uterus, ovaries, adnexal
regions, and pelvic cul-de-sac. It was necessary to proceed with
endovaginal exam following the transabdominal exam to visualize the
endometrium and ovaries..

[Series 1: us pelvis complete · 0.28mm/px · 54 acquisitions, 14 frames shown]
[im 1/54]
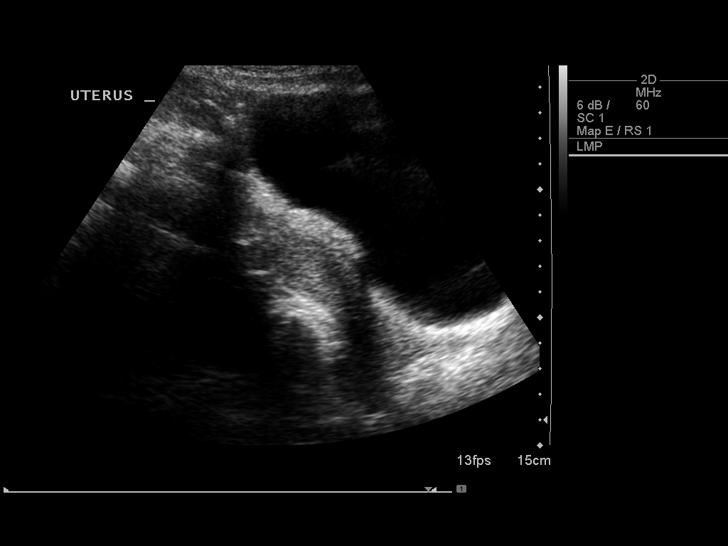
[im 5/54]
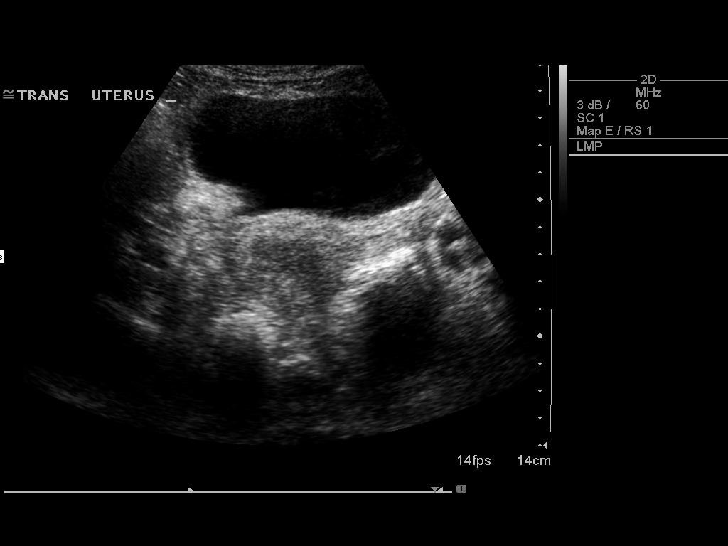
[im 9/54]
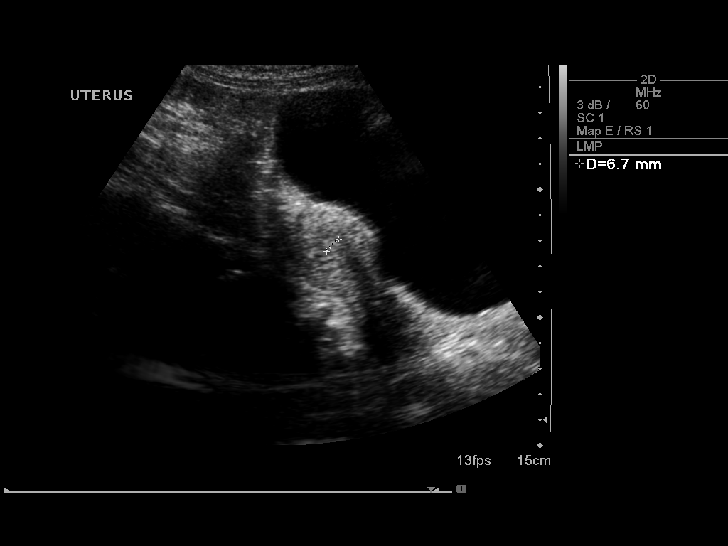
[im 14/54]
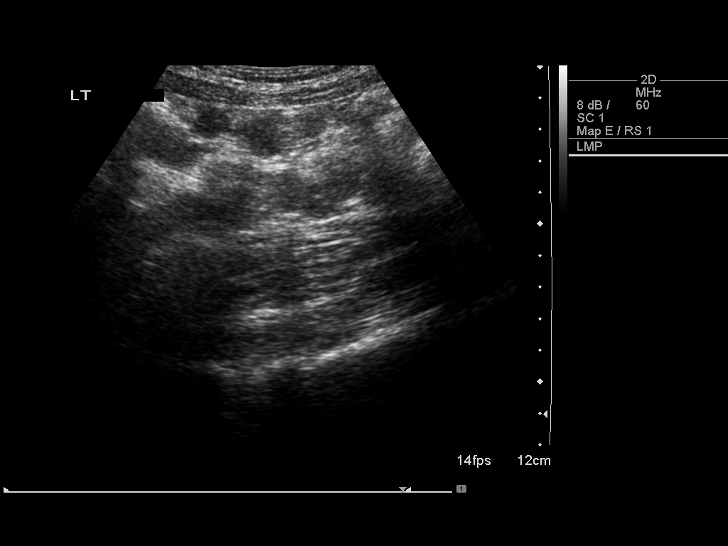
[im 18/54]
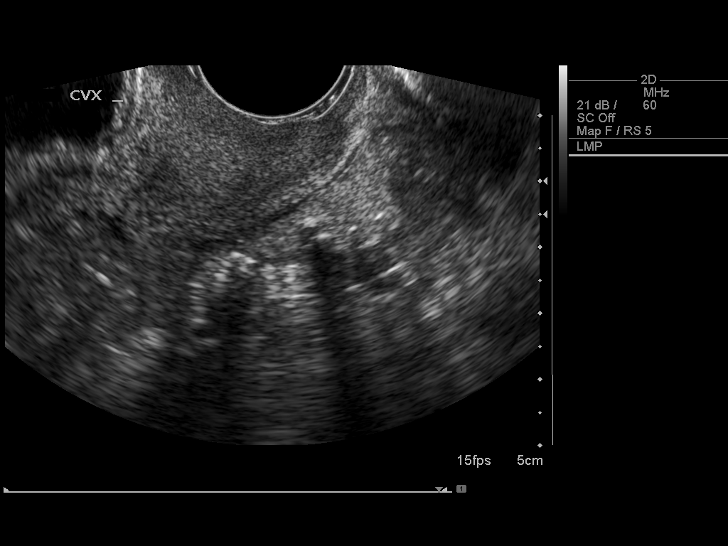
[im 20/54]
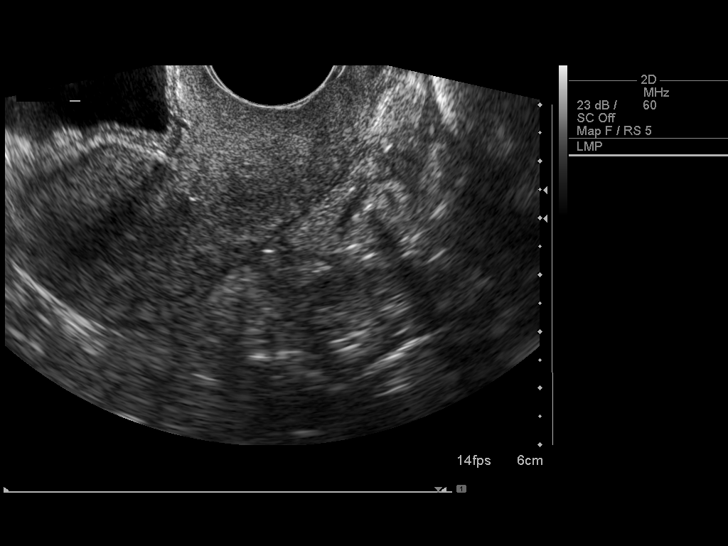
[im 25/54]
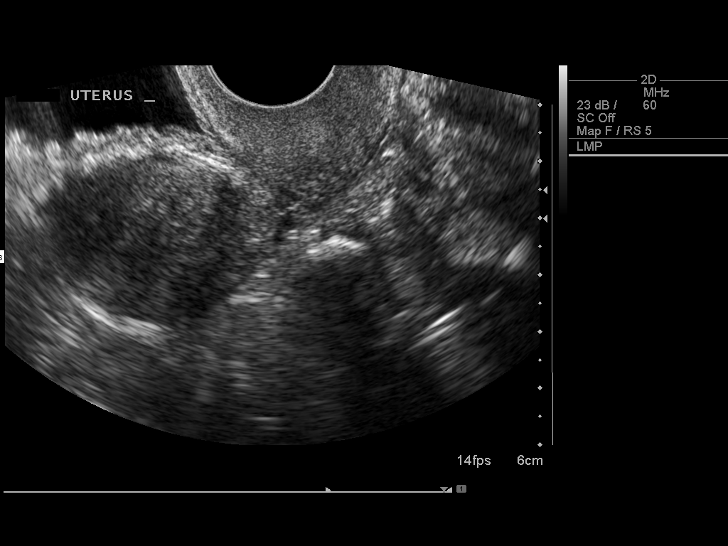
[im 29/54]
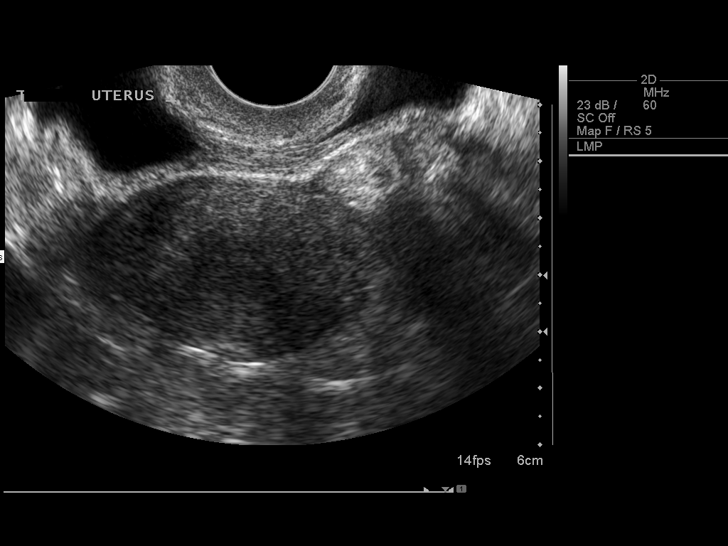
[im 34/54]
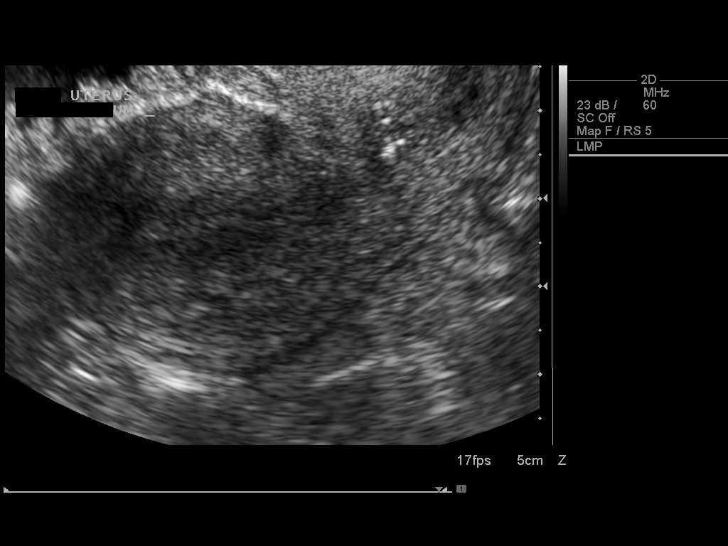
[im 36/54]
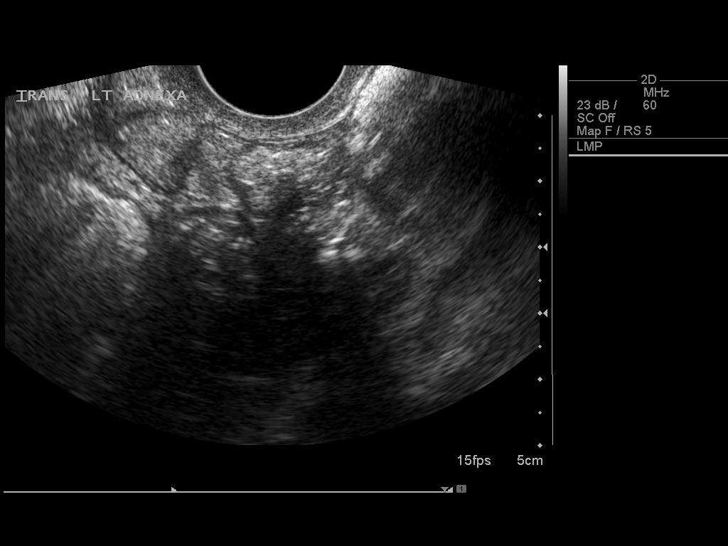
[im 40/54]
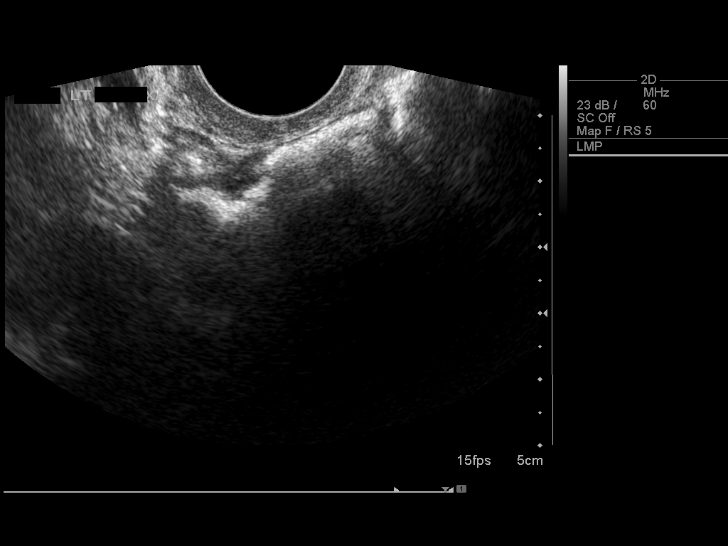
[im 45/54]
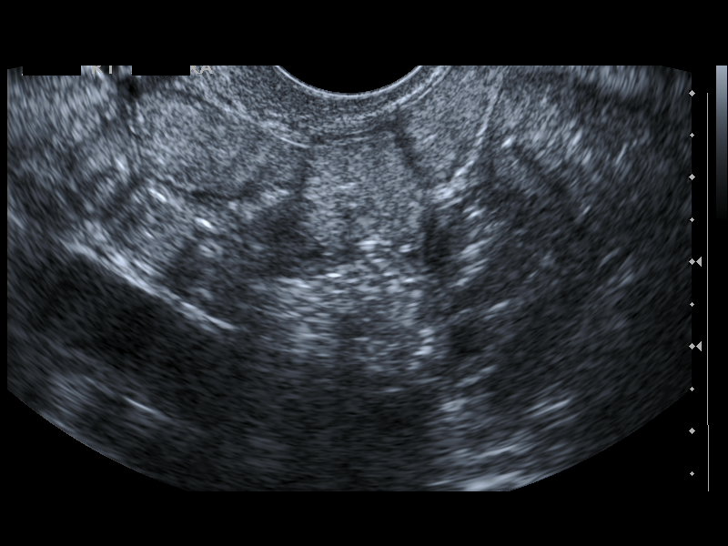
[im 49/54]
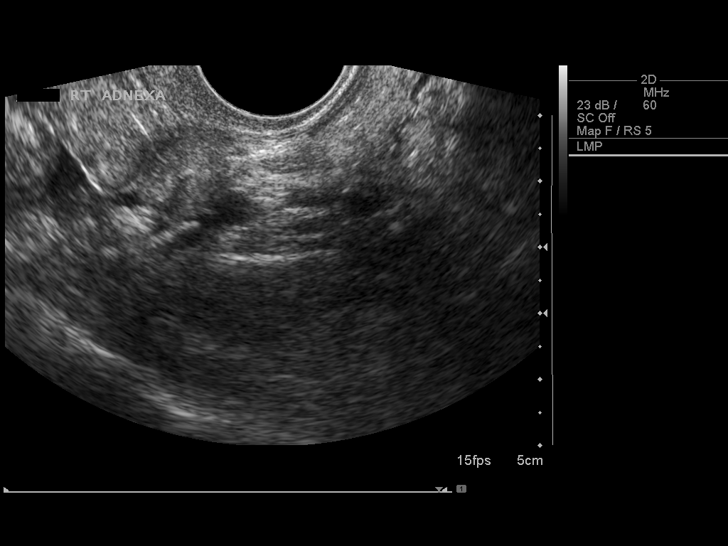
[im 54/54]
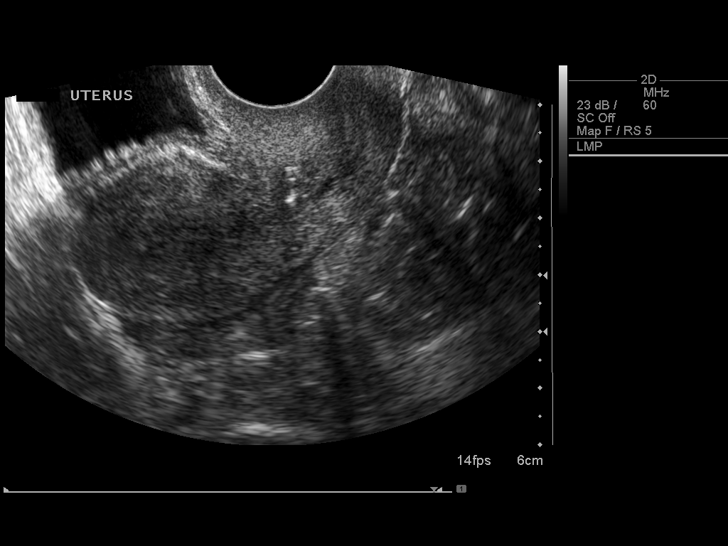

[14 of 25 positions shown; findings below may reference images not displayed]

FINDINGS: Uterus

Measurements: 7.8 x 2.8 x 5.0 cm. No fibroids or other mass
visualized.

Endometrium

Thickness: 2.7 mm.  No focal abnormality visualized.

Right ovary

Not visualized.

Left ovary

Not visualized.

Other findings

No free fluid.
IMPRESSION: Normal appearing uterus and endometrium. The ovaries were not
visualized transvaginally or transabdominally. Extensive bowel
obscures the adnexal regions.

## 2013-07-20 ENCOUNTER — Ambulatory Visit (INDEPENDENT_AMBULATORY_CARE_PROVIDER_SITE_OTHER): Payer: 59 | Admitting: Family Medicine

## 2013-07-20 ENCOUNTER — Encounter: Payer: Self-pay | Admitting: Family Medicine

## 2013-07-20 VITALS — BP 124/72 | HR 90

## 2013-07-20 DIAGNOSIS — M999 Biomechanical lesion, unspecified: Secondary | ICD-10-CM | POA: Insufficient documentation

## 2013-07-20 DIAGNOSIS — G5701 Lesion of sciatic nerve, right lower limb: Secondary | ICD-10-CM

## 2013-07-20 DIAGNOSIS — M533 Sacrococcygeal disorders, not elsewhere classified: Secondary | ICD-10-CM

## 2013-07-20 DIAGNOSIS — G5700 Lesion of sciatic nerve, unspecified lower limb: Secondary | ICD-10-CM

## 2013-07-20 NOTE — Progress Notes (Signed)
  CC: Follow up for SI joint and manipulation.   HPI: Patient was seen previously and diagnosed with a iliolumbar strain. Patient did have prednisone daily for the last 5 days and was given home exercise program that was more specific to her injury. Patient states she is doing remarkably better. Patient states that the prednisone did helped significantly. Patient still states that most the pain is in the lumbar region as well as pointing towards the sacral and gluteal areas. Denies any radiation no legs any numbness. Patient states that she can rest comfortably now. Patient started increasing her activity without any significant discomfort.  Review of Systems: No headache, visual changes, nausea, vomiting, diarrhea, constipation, dizziness, abdominal pain, skin rash, fevers, chills, night sweats, weight loss, swollen lymph nodes, body aches, joint swelling, muscle aches, chest pain, shortness of breath, mood changes.   Objective:    Blood pressure 124/72, pulse 90, last menstrual period 04/16/2008, SpO2 98.00%.   General: No apparent distress alert and oriented x3 mood and affect normal, dressed appropriately.  HEENT: Pupils equal, extraocular movements intact Respiratory: Patient's speak in full sentences and does not appear short of breath Cardiovascular: No lower extremity edema, non tender, no erythema Skin: Warm dry intact with no signs of infection or rash on extremities or on axial skeleton. Abdomen: Soft nontender Neuro: Cranial nerves II through XII are intact, neurovascularly intact in all extremities with 2+ DTRs and 2+ pulses. Lymph: No lymphadenopathy of posterior or anterior cervical chain or axillae bilaterally.  Gait normal with good balance and coordination.  MSK: Non tender with full range of motion and good stability and symmetric strength and tone of shoulders, elbows, wrist,  knee and ankles bilaterally.  Hip: ROM IR: 35 Deg, ER: 45 Deg, Flexion: 120 Deg, Extension: 100 Deg,  Abduction: 45 Deg, Adduction: 45 Deg Strength IR: 5/5, ER: 5/5, Flexion: 5/5, Extension: 5/5, Abduction: 5/5, Adduction: 5/5 Pelvic alignment unremarkable to inspection and palpation. Standing hip rotation and gait without trendelenburg sign / unsteadiness. Trochanteric bursa nontender No tenderness over greater trochanter. No pain with  FADIR. Mild pain with Corky Sox  Patient's right SI joint is severely tender as well as over the right piriformis.  Contralateral hip unremarkable  Osteopathic findings  Thoracic T5 extended rotated and side bent right  Lumbar L2 flexed rotated and side bent right L4 flexed rotated and side bent left  Sacrum Right on right  Impression and Recommendations:     This case required medical decision making of moderate complexity. Spent greater than 25 minutes with patient face-to-face and had greater than 50% of counseling including as described above in assessment and plan.

## 2013-07-20 NOTE — Assessment & Plan Note (Signed)
Patient is improving slowly. Patient given other exercises accompanied or beneficial. Patient will try a tennis ball into the back right pocket. Patient given other home exercises to focus more on this as well as an iliolumbar strain. Patient will come back in 3 weeks for further manipulation.

## 2013-07-20 NOTE — Assessment & Plan Note (Signed)
Decision today to treat with OMT was based on Physical Exam  After verbal consent patient was treated with HVLA, ME  techniques in thoracic lumbar and sacral areas  Patient tolerated the procedure well with improvement in symptoms  Patient given exercises, stretches and lifestyle modifications  See medications in patient instructions if given  Patient will follow up in 3 weeks           

## 2013-07-20 NOTE — Assessment & Plan Note (Signed)
Is making improvement. Discuss core strengthening as well as hip abductor strengthening. Patient is responding well to manipulation. Patient will come back again in 3 weeks.

## 2013-07-20 NOTE — Patient Instructions (Signed)
Good to see you You are always doing better.  Put ball or tennis ball in back pocket Look at new handout and see if any new exercises are there.  Start increasing activity and do what you want.  Lets try to space out to 3 weeks.

## 2013-08-10 ENCOUNTER — Ambulatory Visit (INDEPENDENT_AMBULATORY_CARE_PROVIDER_SITE_OTHER): Payer: 59 | Admitting: Family Medicine

## 2013-08-10 ENCOUNTER — Encounter: Payer: Self-pay | Admitting: Family Medicine

## 2013-08-10 VITALS — BP 116/78 | HR 78

## 2013-08-10 DIAGNOSIS — M999 Biomechanical lesion, unspecified: Secondary | ICD-10-CM

## 2013-08-10 DIAGNOSIS — M533 Sacrococcygeal disorders, not elsewhere classified: Secondary | ICD-10-CM

## 2013-08-10 DIAGNOSIS — M9981 Other biomechanical lesions of cervical region: Secondary | ICD-10-CM

## 2013-08-10 NOTE — Assessment & Plan Note (Signed)
Decision today to treat with OMT was based on Physical Exam  After verbal consent patient was treated with HVLA, ME  techniques in thoracic lumbar and sacral areas  Patient tolerated the procedure well with improvement in symptoms  Patient given exercises, stretches and lifestyle modifications  See medications in patient instructions if given  Patient will follow up in 4 weeks   

## 2013-08-10 NOTE — Progress Notes (Signed)
  CC: Follow up for SI joint and manipulation.   HPI: Patient was seen previously and diagnosed sacroiliac joint disease. Patient has had greater trochanteric bursitis as well but states that she is responding very well to osteopathic manipulation. Patient has been playing more golf as well as tennis recently and continues to improve. Patient denies any radiation down her legs or any numbness. Patient does notice some mild left shoulder discomfort with walking but nothing out of the ordinary. Patient has been happy with some of the results so far.  Review of Systems: No headache, visual changes, nausea, vomiting, diarrhea, constipation, dizziness, abdominal pain, skin rash, fevers, chills, night sweats, weight loss, swollen lymph nodes, body aches, joint swelling, muscle aches, chest pain, shortness of breath, mood changes.   Objective:    Blood pressure 116/78, pulse 78, last menstrual period 04/16/2008, SpO2 99.00%.   General: No apparent distress alert and oriented x3 mood and affect normal, dressed appropriately.  HEENT: Pupils equal, extraocular movements intact Respiratory: Patient's speak in full sentences and does not appear short of breath Cardiovascular: No lower extremity edema, non tender, no erythema Skin: Warm dry intact with no signs of infection or rash on extremities or on axial skeleton. Abdomen: Soft nontender Neuro: Cranial nerves II through XII are intact, neurovascularly intact in all extremities with 2+ DTRs and 2+ pulses. Lymph: No lymphadenopathy of posterior or anterior cervical chain or axillae bilaterally.  Gait normal with good balance and coordination.  MSK: Non tender with full range of motion and good stability and symmetric strength and tone of shoulders, elbows, wrist,  knee and ankles bilaterally.  Hip: ROM IR: 35 Deg, ER: 45 Deg, Flexion: 120 Deg, Extension: 100 Deg, Abduction: 45 Deg, Adduction: 45 Deg Strength IR: 5/5, ER: 5/5, Flexion: 5/5, Extension:  5/5, Abduction: 5/5, Adduction: 5/5 Pelvic alignment unremarkable to inspection and palpation. Standing hip rotation and gait without trendelenburg sign / unsteadiness. Trochanteric bursa nontender No tenderness over greater trochanter. No pain with  FADIR. Mild pain with Ruffin Pyo to have discomfort over the right SI to Contralateral hip unremarkable  Osteopathic findings  Thoracic T5 extended rotated and side bent right T3 and extended rotated and side bent left  Lumbar L2 flexed rotated and side bent right L4 flexed rotated and side bent left  Sacrum Right on right  Impression and Recommendations:     This case required medical decision making of moderate complexity. Spent greater than 25 minutes with patient face-to-face and had greater than 50% of counseling including as described above in assessment and plan.

## 2013-08-10 NOTE — Assessment & Plan Note (Signed)
Doing well, continue exercises, given more posture exercises as well. Patient has been very active. Patient will continue to do all her regular activities and continue to play tennis and golf on a regular basis. She'll come back in 4 weeks for further evaluation and treatment.

## 2013-08-10 NOTE — Patient Instructions (Signed)
Good to see you Continue other exercises Y-T-A 10 reps daily Stand on wall with heels, butt shoulders and head for goal of 5 minutes daily.  Everything else is wonderful.  Come back in 4 weeks.

## 2013-09-08 ENCOUNTER — Ambulatory Visit (INDEPENDENT_AMBULATORY_CARE_PROVIDER_SITE_OTHER): Payer: 59 | Admitting: Family Medicine

## 2013-09-08 ENCOUNTER — Encounter: Payer: Self-pay | Admitting: Family Medicine

## 2013-09-08 VITALS — BP 108/70 | HR 69 | Ht 62.0 in

## 2013-09-08 DIAGNOSIS — M216X9 Other acquired deformities of unspecified foot: Secondary | ICD-10-CM

## 2013-09-08 DIAGNOSIS — M533 Sacrococcygeal disorders, not elsewhere classified: Secondary | ICD-10-CM

## 2013-09-08 DIAGNOSIS — M9981 Other biomechanical lesions of cervical region: Secondary | ICD-10-CM

## 2013-09-08 DIAGNOSIS — M999 Biomechanical lesion, unspecified: Secondary | ICD-10-CM

## 2013-09-08 NOTE — Progress Notes (Signed)
  CC: Follow up for SI joint and manipulation.   HPI: Patient was seen previously and diagnosed sacroiliac joint disease. Patient has had greater trochanteric bursitis as well but states that she is responding very well to osteopathic manipulation. Patient in plate 18 holes of golf and walk it 5 days straight last week. Patient states that she's not having any significant discomfort except for some mild foot discomfort. Otherwise patient states that her back seems to be doing significantly better. Still has a twinge from time to time. States overall though she is doing much more like herself.  Review of Systems: No headache, visual changes, nausea, vomiting, diarrhea, constipation, dizziness, abdominal pain, skin rash, fevers, chills, night sweats, weight loss, swollen lymph nodes, body aches, joint swelling, muscle aches, chest pain, shortness of breath, mood changes.   Objective:    Blood pressure 108/70, pulse 69, height 5\' 2"  (1.575 m), last menstrual period 04/16/2008, SpO2 99.00%.   General: No apparent distress alert and oriented x3 mood and affect normal, dressed appropriately.  HEENT: Pupils equal, extraocular movements intact Respiratory: Patient's speak in full sentences and does not appear short of breath Cardiovascular: No lower extremity edema, non tender, no erythema Skin: Warm dry intact with no signs of infection or rash on extremities or on axial skeleton. Abdomen: Soft nontender Neuro: Cranial nerves II through XII are intact, neurovascularly intact in all extremities with 2+ DTRs and 2+ pulses. Lymph: No lymphadenopathy of posterior or anterior cervical chain or axillae bilaterally.  Gait normal with good balance and coordination.  MSK: Non tender with full range of motion and good stability and symmetric strength and tone of shoulders, elbows, wrist,  knee and ankles bilaterally.  Hip: ROM IR: 35 Deg, ER: 45 Deg, Flexion: 120 Deg, Extension: 100 Deg, Abduction: 45 Deg,  Adduction: 45 Deg Strength IR: 5/5, ER: 5/5, Flexion: 5/5, Extension: 5/5, Abduction: 5/5, Adduction: 5/5 Pelvic alignment unremarkable to inspection and palpation. Standing hip rotation and gait without trendelenburg sign / unsteadiness. Trochanteric bursa nontender No tenderness over greater trochanter. No pain with  FADIR. Mild pain with Corky Sox but continues to improve each visit Continues to have discomfort over the right SI joint Contralateral hip unremarkable  Osteopathic findings  Thoracic T5 extended rotated and side bent right T3 and extended rotated and side bent left  Lumbar L2 flexed rotated and side bent right L4 flexed rotated and side bent left  Sacrum Right on right  Exam shows the patient does have loss of the transverse arch bilaterally with bunion formation of the large toes. Otherwise fairly unremarkable.  Impression and Recommendations:     This case required medical decision making of moderate complexity. Spent greater than 25 minutes with patient face-to-face and had greater than 50% of counseling including as described above in assessment and plan.

## 2013-09-08 NOTE — Assessment & Plan Note (Signed)
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 Patient is to significant amount activity that she would be a candidate for custom orthotics. Patient will come back again in one to 2 weeks for evaluation and likely custom orthotics.

## 2013-09-08 NOTE — Assessment & Plan Note (Signed)
Patient continues to improve very well. Patient given home exercise and we'll do a regular basis. We discussed new exercises and continue to wear the heel lift secondary to the leg length discrepancy. Patient is doing so much activity I do think patient would benefit from orthotics.

## 2013-09-08 NOTE — Patient Instructions (Signed)
You are doing great!!!! Keep it up Next week come back and we will make orthotics for you.

## 2013-09-08 NOTE — Assessment & Plan Note (Signed)
Decision today to treat with OMT was based on Physical Exam  After verbal consent patient was treated with HVLA, ME  techniques in thoracic lumbar and sacral areas  Patient tolerated the procedure well with improvement in symptoms  Patient given exercises, stretches and lifestyle modifications  See medications in patient instructions if given  Patient will follow up in 6 weeks         

## 2013-09-16 ENCOUNTER — Ambulatory Visit (INDEPENDENT_AMBULATORY_CARE_PROVIDER_SITE_OTHER): Payer: 59 | Admitting: Family Medicine

## 2013-09-16 ENCOUNTER — Encounter: Payer: Self-pay | Admitting: Family Medicine

## 2013-09-16 VITALS — BP 116/64 | HR 67 | Ht 62.0 in | Wt 120.0 lb

## 2013-09-16 DIAGNOSIS — M216X9 Other acquired deformities of unspecified foot: Secondary | ICD-10-CM

## 2013-09-16 NOTE — Progress Notes (Signed)
  CC: Follow up pain  HPI: Patient is here for followup of pain. Patient has had multiple different injuries over the course of time. Most of them seem to be over the lower back. Without a patient's feet are starting to cause more pain. Patient is an avid golfer and find its difficult to do due to golfing secondary to the amount of pain in her feet. Patient has had to stop a couple times. Patient was found to have some collapsing of the arch previously. Patient has tried over-the-counter orthotics without any significant improvement. Patient likely remain active and wants to know if anything can be done for the feet. We've attempted to fix a leg length discrepancy previously. No numbness or tingling in the feet. Patient states that the pain is 6/10 in severity.  Review of Systems: No headache, visual changes, nausea, vomiting, diarrhea, constipation, dizziness, abdominal pain, skin rash, fevers, chills, night sweats, weight loss, swollen lymph nodes, body aches, joint swelling, muscle aches, chest pain, shortness of breath, mood changes.   Objective:    Blood pressure 116/64, pulse 67, height 5\' 2"  (1.575 m), weight 120 lb (54.432 kg), last menstrual period 04/16/2008, SpO2 98.00%.   General: No apparent distress alert and oriented x3 mood and affect normal, dressed appropriately.  HEENT: Pupils equal, extraocular movements intact Respiratory: Patient's speak in full sentences and does not appear short of breath Cardiovascular: No lower extremity edema, non tender, no erythema Skin: Warm dry intact with no signs of infection or rash on extremities or on axial skeleton. Abdomen: Soft nontender Neuro: Cranial nerves II through XII are intact, neurovascularly intact in all extremities with 2+ DTRs and 2+ pulses. Lymph: No lymphadenopathy of posterior or anterior cervical chain or axillae bilaterally.  Gait normal with good balance and coordination.  MSK: Non tender with full range of motion and good  stability and symmetric strength and tone of shoulders, elbows, wrist,  knee and ankles bilaterally.  Hip: ROM IR: 35 Deg, ER: 45 Deg, Flexion: 120 Deg, Extension: 100 Deg, Abduction: 45 Deg, Adduction: 45 Deg Strength IR: 5/5, ER: 5/5, Flexion: 5/5, Extension: 5/5, Abduction: 5/5, Adduction: 5/5 Pelvic alignment unremarkable to inspection and palpation. Standing hip rotation and gait without trendelenburg sign / unsteadiness. Trochanteric bursa nontender No tenderness over greater trochanter. No pain with  FADIR. Mild pain with Corky Sox but continues to improve each visit Continues to have discomfort over the right SI joint Contralateral hip unremarkable   Exam shows the patient does have loss of the transverse arch bilaterally with bunion formation of the large toes. Otherwise fairly unremarkable.   Patient was fitted for a : standard, cushioned, semi-rigid orthotic. The orthotic was heated and afterward the patient stood on the orthotic blank positioned on the orthotic stand. The patient was positioned in subtalar neutral position and 10 degrees of ankle dorsiflexion in a weight bearing stance. After completion of molding, a stable base was applied to the orthotic blank. The blank was ground to a stable position for weight bearing. Size: 7 Base: Blue EVA Additional Posting and Padding: None The patient ambulated these, and they were very comfortable.  I spent 45 minutes with this patient, greater than 50% was face-to-face time counseling regarding the below diagnosis.   Impression and Recommendations:    .

## 2013-09-16 NOTE — Patient Instructions (Signed)
It is good to see you Start the orthotics at 3 hours daily first day and increase 1 hour a day thereafter Will see you soon for manipulation.

## 2013-09-16 NOTE — Assessment & Plan Note (Signed)
Orthotic material was me today. Patient was given suggestion to increase the amount of time slowly over the course of time. We discussed how this would likely fit and most of her shoes. Patient last 2-3 years. Patient will try this and come back again in 2 weeks for further evaluation.

## 2013-10-19 ENCOUNTER — Ambulatory Visit (INDEPENDENT_AMBULATORY_CARE_PROVIDER_SITE_OTHER): Payer: 59 | Admitting: Family Medicine

## 2013-10-19 ENCOUNTER — Encounter: Payer: Self-pay | Admitting: Family Medicine

## 2013-10-19 VITALS — BP 118/72 | HR 63 | Ht 62.0 in | Wt 119.0 lb

## 2013-10-19 DIAGNOSIS — M533 Sacrococcygeal disorders, not elsewhere classified: Secondary | ICD-10-CM

## 2013-10-19 DIAGNOSIS — M9981 Other biomechanical lesions of cervical region: Secondary | ICD-10-CM

## 2013-10-19 DIAGNOSIS — M999 Biomechanical lesion, unspecified: Secondary | ICD-10-CM

## 2013-10-19 NOTE — Assessment & Plan Note (Signed)
Patient does have some mild dysfunction and will continue to have some mild dysfunction. Patient responded well to osteopathic manipulation today. I do not think that there is anything that increase patient's likelihood of this exacerbation. But overall patient was given a new exercises to work on the hip abductor strengthening and. Patient will try these interventions and come back again in 3 weeks for further evaluation and treatment.  Spent greater than 25 minutes with patient face-to-face and had greater than 50% of counseling including as described above in assessment and plan.

## 2013-10-19 NOTE — Assessment & Plan Note (Signed)
Decision today to treat with OMT was based on Physical Exam  After verbal consent patient was treated with HVLA, ME  techniques in thoracic lumbar and sacral areas  Patient tolerated the procedure well with improvement in symptoms  Patient given exercises, stretches and lifestyle modifications  See medications in patient instructions if given  Patient will follow up in 3 weeks           

## 2013-10-19 NOTE — Patient Instructions (Signed)
Good to see you.  Ice can still be your friend Continue the exercises including the one on the wall of 30 reps daily.  Try the pennsaid twice daily. If you like it I will send in prescription.  Come back in 3 weeks.

## 2013-10-19 NOTE — Progress Notes (Signed)
  CC: Follow up for SI joint and manipulation.   HPI: Patient was seen previously and diagnosed sacroiliac joint disease. Patient states that the pain is somewhat worse than it has been recently. Patient's had significant pelvic problems previously including the greater trochanteric bursitis as well as leg length discrepancy and I strain of the flexor that has all compensated here. Patient states that she has been playing a little bit more golf but nothing significantly out of the ordinary. Denies any new shoes but has been wearing the orthotics are made for her previously and states that this has been helpful for the foot and leg pain she was having regularly. Patient is still very active overall.  Review of Systems: No headache, visual changes, nausea, vomiting, diarrhea, constipation, dizziness, abdominal pain, skin rash, fevers, chills, night sweats, weight loss, swollen lymph nodes, body aches, joint swelling, muscle aches, chest pain, shortness of breath, mood changes.   Objective:    Blood pressure 118/72, pulse 63, height 5\' 2"  (1.575 m), weight 119 lb (53.978 kg), last menstrual period 04/16/2008, SpO2 99.00%.   General: No apparent distress alert and oriented x3 mood and affect normal, dressed appropriately.  HEENT: Pupils equal, extraocular movements intact Respiratory: Patient's speak in full sentences and does not appear short of breath Cardiovascular: No lower extremity edema, non tender, no erythema Skin: Warm dry intact with no signs of infection or rash on extremities or on axial skeleton. Abdomen: Soft nontender Neuro: Cranial nerves II through XII are intact, neurovascularly intact in all extremities with 2+ DTRs and 2+ pulses. Lymph: No lymphadenopathy of posterior or anterior cervical chain or axillae bilaterally.  Gait normal with good balance and coordination.  MSK: Non tender with full range of motion and good stability and symmetric strength and tone of shoulders, elbows,  wrist,  knee and ankles bilaterally.  Hip: Right ROM IR: 35 Deg, ER: 45 Deg, Flexion: 120 Deg, Extension: 100 Deg, Abduction: 45 Deg, Adduction: 45 Deg Strength IR: 5/5, ER: 5/5, Flexion: 5/5, Extension: 5/5, Abduction: 4/5 compared to contralateral side. Adduction: 5/5 Pelvic alignment unremarkable to inspection and palpation. Standing hip rotation and gait without trendelenburg sign / unsteadiness. Trochanteric bursa nontender No tenderness over greater trochanter. No pain with  FADIR. Moderate pain with Corky Sox as well as tender to palpation over the ischial tuberosity and piriformis muscle. Continues to have discomfort over the right SI joint Contralateral hip unremarkable with any type of pain the patient is stiff with external rotation as well as internal rotation compared to the contralateral side but no pain.  Osteopathic findings  Thoracic T5 extended rotated and side bent right T3 and extended rotated and side bent left  Lumbar L2 flexed rotated and side bent right L4 flexed rotated and side bent left  Sacrum Right on right  Impression and Recommendations:     This case required medical decision making of moderate complexity.

## 2013-11-09 ENCOUNTER — Ambulatory Visit (INDEPENDENT_AMBULATORY_CARE_PROVIDER_SITE_OTHER): Payer: 59 | Admitting: Family Medicine

## 2013-11-09 ENCOUNTER — Encounter: Payer: Self-pay | Admitting: Family Medicine

## 2013-11-09 VITALS — BP 114/72 | HR 78 | Ht 62.0 in | Wt 121.0 lb

## 2013-11-09 DIAGNOSIS — M999 Biomechanical lesion, unspecified: Secondary | ICD-10-CM

## 2013-11-09 DIAGNOSIS — M533 Sacrococcygeal disorders, not elsewhere classified: Secondary | ICD-10-CM

## 2013-11-09 DIAGNOSIS — M9981 Other biomechanical lesions of cervical region: Secondary | ICD-10-CM

## 2013-11-09 NOTE — Progress Notes (Signed)
  CC: Follow up for SI joint and manipulation.   HPI: Patient was seen previously and diagnosed sacroiliac joint disease. Has been doing exercises, taking no medicine, doing all ADL's and playing golf 4 times a week. No new symptoms.   Review of Systems: No headache, visual changes, nausea, vomiting, diarrhea, constipation, dizziness, abdominal pain, skin rash, fevers, chills, night sweats, weight loss, swollen lymph nodes, body aches, joint swelling, muscle aches, chest pain, shortness of breath, mood changes.   Objective:    Blood pressure 114/72, pulse 78, height 5\' 2"  (1.575 m), weight 121 lb (54.885 kg), last menstrual period 04/16/2008, SpO2 99.00%.   General: No apparent distress alert and oriented x3 mood and affect normal, dressed appropriately.  HEENT: Pupils equal, extraocular movements intact Respiratory: Patient's speak in full sentences and does not appear short of breath Cardiovascular: No lower extremity edema, non tender, no erythema Skin: Warm dry intact with no signs of infection or rash on extremities or on axial skeleton. Abdomen: Soft nontender Neuro: Cranial nerves II through XII are intact, neurovascularly intact in all extremities with 2+ DTRs and 2+ pulses. Lymph: No lymphadenopathy of posterior or anterior cervical chain or axillae bilaterally.  Gait normal with good balance and coordination.  MSK: Non tender with full range of motion and good stability and symmetric strength and tone of shoulders, elbows, wrist,  knee and ankles bilaterally.  Hip: Right ROM IR: 35 Deg, ER: 45 Deg, Flexion: 110 Deg, Extension: 100 Deg, Abduction: 45 Deg, Adduction: 45 Deg Strength IR: 5/5, ER: 5/5, Flexion: 5/5, Extension: 5/5, Abduction: 4/5 compared to contralateral side. Adduction: 5/5 Pelvic alignment unremarkable to inspection and palpation. Standing hip rotation and gait without trendelenburg sign / unsteadiness. Trochanteric bursa nontender No tenderness over greater  trochanter. No pain with  FADIR. Mild pain with Corky Sox as well as tender to palpation over the ischial tuberosity and piriformis muscle. Mild discomfort over right SI joint Contralateral hip unremarkable with any type of pain the patient is stiff with external rotation as well as internal rotation compared to the contralateral side but no pain.  Osteopathic findings  Thoracic T7 E RS right T5 extended rotated and side bent right T3 and extended rotated and side bent left  Lumbar L2 flexed rotated and side bent right L4 flexed rotated and side bent left  Sacrum Right on right  Impression and Recommendations:     This case required medical decision making of moderate complexity.

## 2013-11-09 NOTE — Patient Instructions (Signed)
Good to see you You are doing great.  New exercise.  Y-T-A 2 seconds in each position for total of 10 reps.  Come back in 4 weeks.

## 2013-11-09 NOTE — Assessment & Plan Note (Signed)
Patient was given other exercises to do a regular basis improving the thoracic and lumbar strengthening. Discuss continuing the other exercises on proper shoe wear. Discussed the importance of hip abductor strengthening. Patient and will come back again and see me again in 4 weeks. Spent greater than 25 minutes with patient face-to-face and had greater than 50% of counseling including as described above in assessment and plan.

## 2013-11-09 NOTE — Assessment & Plan Note (Signed)
Decision today to treat with OMT was based on Physical Exam  After verbal consent patient was treated with HVLA, ME  techniques in thoracic lumbar and sacral areas  Patient tolerated the procedure well with improvement in symptoms  Patient given exercises, stretches and lifestyle modifications  See medications in patient instructions if given  Patient will follow up in 4 weeks

## 2013-12-07 ENCOUNTER — Ambulatory Visit (INDEPENDENT_AMBULATORY_CARE_PROVIDER_SITE_OTHER): Payer: 59 | Admitting: Family Medicine

## 2013-12-07 ENCOUNTER — Encounter: Payer: Self-pay | Admitting: Family Medicine

## 2013-12-07 VITALS — BP 114/72 | HR 65 | Ht 62.0 in | Wt 119.0 lb

## 2013-12-07 DIAGNOSIS — M533 Sacrococcygeal disorders, not elsewhere classified: Secondary | ICD-10-CM

## 2013-12-07 DIAGNOSIS — M999 Biomechanical lesion, unspecified: Secondary | ICD-10-CM

## 2013-12-07 MED ORDER — DICLOFENAC SODIUM 2 % TD SOLN: TRANSDERMAL | Status: DC

## 2013-12-07 NOTE — Patient Instructions (Signed)
Good to see you as always.  Continue to work on hip abductors and core strength.  The back and neck are great.  Come back in 5 weeks.

## 2013-12-07 NOTE — Progress Notes (Signed)
  CC: Follow up for SI joint and manipulation.   HPI: Patient was seen previously and diagnosed sacroiliac joint disease. Has been doing exercises, taking no medicine, doing all ADL's and playing golf 4 times a week. No new symptoms. Patient though has been out of work for the last week. Patient states that she has been feeling good overall.  Review of Systems: No headache, visual changes, nausea, vomiting, diarrhea, constipation, dizziness, abdominal pain, skin rash, fevers, chills, night sweats, weight loss, swollen lymph nodes, body aches, joint swelling, muscle aches, chest pain, shortness of breath, mood changes.   Objective:    Blood pressure 114/72, pulse 65, height 5\' 2"  (1.575 m), weight 119 lb (53.978 kg), last menstrual period 04/16/2008, SpO2 98.00%.   General: No apparent distress alert and oriented x3 mood and affect normal, dressed appropriately.  HEENT: Pupils equal, extraocular movements intact Respiratory: Patient's speak in full sentences and does not appear short of breath Cardiovascular: No lower extremity edema, non tender, no erythema Skin: Warm dry intact with no signs of infection or rash on extremities or on axial skeleton. Abdomen: Soft nontender Neuro: Cranial nerves II through XII are intact, neurovascularly intact in all extremities with 2+ DTRs and 2+ pulses. Lymph: No lymphadenopathy of posterior or anterior cervical chain or axillae bilaterally.  Gait normal with good balance and coordination.  MSK: Non tender with full range of motion and good stability and symmetric strength and tone of shoulders, elbows, wrist,  knee hips and ankles bilaterally.   Osteopathic findings  Thoracic T7 E RS right T5 extended rotated and side bent right T3 and extended rotated and side bent left  Lumbar L2 flexed rotated and side bent right L5 flexed rotated and side bent left  Sacrum Right on right  Impression and Recommendations:     This case required medical  decision making of moderate complexity.

## 2013-12-07 NOTE — Assessment & Plan Note (Signed)
Decision today to treat with OMT was based on Physical Exam  After verbal consent patient was treated with HVLA, ME  techniques in thoracic lumbar and sacral areas  Patient tolerated the procedure well with improvement in symptoms  Patient given exercises, stretches and lifestyle modifications  See medications in patient instructions if given  Patient will follow up in 4 weeks

## 2013-12-07 NOTE — Assessment & Plan Note (Addendum)
Patient was given other exercises to do a regular basis improving the thoracic and lumbar strengthening. I discussed the importance of core strengthening J. patient topical anti-inflammatory and prescription was sent in. Discuss continuing the other exercises on proper shoe wear. Discussed the importance of hip abductor strengthening. Patient and will come back again and see me again in 4 weeks. Spent greater than 25 minutes with patient face-to-face and had greater than 50% of counseling including as described above in assessment and plan.

## 2014-01-18 ENCOUNTER — Encounter: Payer: Self-pay | Admitting: Family Medicine

## 2014-01-18 ENCOUNTER — Ambulatory Visit (INDEPENDENT_AMBULATORY_CARE_PROVIDER_SITE_OTHER): Payer: 59 | Admitting: Family Medicine

## 2014-01-18 VITALS — BP 112/80 | HR 65 | Ht 62.0 in | Wt 119.0 lb

## 2014-01-18 DIAGNOSIS — M9904 Segmental and somatic dysfunction of sacral region: Secondary | ICD-10-CM

## 2014-01-18 DIAGNOSIS — M9903 Segmental and somatic dysfunction of lumbar region: Secondary | ICD-10-CM

## 2014-01-18 DIAGNOSIS — M533 Sacrococcygeal disorders, not elsewhere classified: Secondary | ICD-10-CM

## 2014-01-18 DIAGNOSIS — M999 Biomechanical lesion, unspecified: Secondary | ICD-10-CM

## 2014-01-18 DIAGNOSIS — M9902 Segmental and somatic dysfunction of thoracic region: Secondary | ICD-10-CM

## 2014-01-18 NOTE — Assessment & Plan Note (Signed)
Patient continues to respond very well to manipulation medicine. Patient is actually going to start work again and have more of a regular routine difficulty beneficial for her. Kercher continue to increase her activity as tolerated. Patient will continue with the over-the-counter medications. Discuss different exercises ensure proper technique for core strengthening that could be beneficial. Patient then follow up with me in 7 weeks

## 2014-01-18 NOTE — Patient Instructions (Signed)
Good to see you as always You are doing amazing overall.  Continue what you are doing.  Try the orthotic in your boot and if you need me to look at them I will See me again in 7 weeks.

## 2014-01-18 NOTE — Progress Notes (Signed)
  CC: Follow up for SI joint and manipulation.   HPI: Patient was seen previously and diagnosed sacroiliac joint disease. Patient has been doing well overall. Patient has been out of work for the last several weeks and because of the weather she has not been playing tennis or golf on a regular basis. Patient though states that overall she continues to do relatively well. It has been 6 weeks since her previous followup. Patient still use the topical anti-inflammatory when needed. Patient states that she notices when she wears heels akin below the more uncomfortable.  Review of Systems: No headache, visual changes, nausea, vomiting, diarrhea, constipation, dizziness, abdominal pain, skin rash, fevers, chills, night sweats, weight loss, swollen lymph nodes, body aches, joint swelling, muscle aches, chest pain, shortness of breath, mood changes.   Objective:    Blood pressure 112/80, pulse 65, height 5\' 2"  (1.575 m), weight 119 lb (53.978 kg), last menstrual period 04/16/2008, SpO2 98.00%.   General: No apparent distress alert and oriented x3 mood and affect normal, dressed appropriately.  HEENT: Pupils equal, extraocular movements intact Respiratory: Patient's speak in full sentences and does not appear short of breath Cardiovascular: No lower extremity edema, non tender, no erythema Skin: Warm dry intact with no signs of infection or rash on extremities or on axial skeleton. Abdomen: Soft nontender Neuro: Cranial nerves II through XII are intact, neurovascularly intact in all extremities with 2+ DTRs and 2+ pulses. Lymph: No lymphadenopathy of posterior or anterior cervical chain or axillae bilaterally.  Gait normal with good balance and coordination.  MSK: Non tender with full range of motion and good stability and symmetric strength and tone of shoulders, elbows, wrist,  knee hips and ankles bilaterally.   Osteopathic findings  Thoracic T3 and extended rotated and side bent left T5 extended  rotated and side bent right  Lumbar L2 flexed rotated and side bent right L5 flexed rotated and side bent left  Sacrum Right on right  Impression and Recommendations:     This case required medical decision making of moderate complexity.

## 2014-01-18 NOTE — Assessment & Plan Note (Signed)
Decision today to treat with OMT was based on Physical Exam  After verbal consent patient was treated with HVLA, ME  techniques in thoracic lumbar and sacral areas  Patient tolerated the procedure well with improvement in symptoms  Patient given exercises, stretches and lifestyle modifications  See medications in patient instructions if given  Patient will follow up in 7 weeks

## 2014-02-15 ENCOUNTER — Encounter: Payer: Self-pay | Admitting: Family Medicine

## 2014-03-23 ENCOUNTER — Encounter: Payer: Self-pay | Admitting: Family Medicine

## 2014-03-23 ENCOUNTER — Ambulatory Visit (INDEPENDENT_AMBULATORY_CARE_PROVIDER_SITE_OTHER): Payer: 59 | Admitting: Family Medicine

## 2014-03-23 VITALS — BP 114/70 | HR 59 | Ht 62.0 in | Wt 120.0 lb

## 2014-03-23 DIAGNOSIS — M9904 Segmental and somatic dysfunction of sacral region: Secondary | ICD-10-CM

## 2014-03-23 DIAGNOSIS — M999 Biomechanical lesion, unspecified: Secondary | ICD-10-CM

## 2014-03-23 DIAGNOSIS — M62838 Other muscle spasm: Secondary | ICD-10-CM | POA: Insufficient documentation

## 2014-03-23 DIAGNOSIS — M9902 Segmental and somatic dysfunction of thoracic region: Secondary | ICD-10-CM

## 2014-03-23 DIAGNOSIS — M9901 Segmental and somatic dysfunction of cervical region: Secondary | ICD-10-CM

## 2014-03-23 DIAGNOSIS — M6248 Contracture of muscle, other site: Secondary | ICD-10-CM

## 2014-03-23 DIAGNOSIS — M9903 Segmental and somatic dysfunction of lumbar region: Secondary | ICD-10-CM

## 2014-03-23 NOTE — Assessment & Plan Note (Signed)
Patient given home exercises, self manipular relation techniques, topical anti-inflammatories. Patient is to try these different changes. I do think this is multifactorial and stress can play a role. Encourage her to try to do some stress relieving techniques as well as postural training. Patient and will come back and see me again in 3-4 weeks for further evaluation and treatment.  Spent greater than 25 minutes with patient face-to-face and had greater than 50% of counseling including as described above in assessment and plan.

## 2014-03-23 NOTE — Progress Notes (Signed)
  CC: Follow up for SI joint and manipulation.   HPI: Patient was seen previously and diagnosed sacroiliac joint disease. He has been quite some time since that seem patient previously. Patient is actually having more headaches secondary to the tightness in the upper back as well. Patient has had elevated first rib previously. Patient feels like she has been more stress than usual. Patient denies any radiation into the arms or any numbness or tingling. This is not stopping her from any activity. This is just uncomfortable.  Review of Systems: No headache, visual changes, nausea, vomiting, diarrhea, constipation, dizziness, abdominal pain, skin rash, fevers, chills, night sweats, weight loss, swollen lymph nodes, body aches, joint swelling, muscle aches, chest pain, shortness of breath, mood changes.   Objective:    Blood pressure 114/70, pulse 59, height 5\' 2"  (1.575 m), weight 120 lb (54.432 kg), last menstrual period 04/16/2008, SpO2 99 %.   General: No apparent distress alert and oriented x3 mood and affect normal, dressed appropriately.  HEENT: Pupils equal, extraocular movements intact Respiratory: Patient's speak in full sentences and does not appear short of breath Cardiovascular: No lower extremity edema, non tender, no erythema Skin: Warm dry intact with no signs of infection or rash on extremities or on axial skeleton. Abdomen: Soft nontender Neuro: Cranial nerves II through XII are intact, neurovascularly intact in all extremities with 2+ DTRs and 2+ pulses. Lymph: No lymphadenopathy of posterior or anterior cervical chain or axillae bilaterally.  Gait normal with good balance and coordination.  MSK: Non tender with full range of motion and good stability and symmetric strength and tone of shoulders, elbows, wrist,  knee hips and ankles bilaterally.   Osteopathic findings Cervical C7 flexed rotated and side bent left Mild spasm in the trapezius  Thoracic T3 and extended rotated  and side bent left T5 extended rotated and side bent right  Lumbar L2 flexed rotated and side bent right L5 flexed rotated and side bent left  Sacrum Right on right  Impression and Recommendations:     This case required medical decision making of moderate complexity.

## 2014-03-23 NOTE — Patient Instructions (Signed)
Good to see you Focus still on sleeping position and keep working posture.  Try the pennsaid in the area 2 times a day Ice right after walking See me again in 2-3 weeks.

## 2014-03-23 NOTE — Assessment & Plan Note (Signed)
Decision today to treat with OMT was based on Physical Exam  After verbal consent patient was treated with HVLA, ME  techniques in thoracic lumbar and sacral areas  Patient tolerated the procedure well with improvement in symptoms  Patient given exercises, stretches and lifestyle modifications  See medications in patient instructions if given  Patient will follow up in 3-4 weeks

## 2014-04-19 ENCOUNTER — Ambulatory Visit (INDEPENDENT_AMBULATORY_CARE_PROVIDER_SITE_OTHER): Payer: 59 | Admitting: Family Medicine

## 2014-04-19 ENCOUNTER — Encounter: Payer: Self-pay | Admitting: Family Medicine

## 2014-04-19 VITALS — BP 122/74 | HR 70 | Ht 62.0 in | Wt 120.0 lb

## 2014-04-19 DIAGNOSIS — M9902 Segmental and somatic dysfunction of thoracic region: Secondary | ICD-10-CM

## 2014-04-19 DIAGNOSIS — M533 Sacrococcygeal disorders, not elsewhere classified: Secondary | ICD-10-CM

## 2014-04-19 DIAGNOSIS — M9904 Segmental and somatic dysfunction of sacral region: Secondary | ICD-10-CM

## 2014-04-19 DIAGNOSIS — M9901 Segmental and somatic dysfunction of cervical region: Secondary | ICD-10-CM

## 2014-04-19 DIAGNOSIS — M9903 Segmental and somatic dysfunction of lumbar region: Secondary | ICD-10-CM

## 2014-04-19 DIAGNOSIS — M999 Biomechanical lesion, unspecified: Secondary | ICD-10-CM

## 2014-04-19 NOTE — Progress Notes (Signed)
  CC: Follow up for SI joint and manipulation.   HPI: Patient was seen previously and diagnosed sacroiliac joint disease. Patient is not having any headaches recently. Has had some mild neck discomfort because she has been traveling for the holidays. Patient was able to play golf the other day without any significant pain. Patient though did go bowling which was very painful. Some mild upper back pain as well as lower back pain. Nothing that is stopping her from her activities.  Review of Systems: No headache, visual changes, nausea, vomiting, diarrhea, constipation, dizziness, abdominal pain, skin rash, fevers, chills, night sweats, weight loss, swollen lymph nodes, body aches, joint swelling, muscle aches, chest pain, shortness of breath, mood changes.   Objective:    Blood pressure 122/74, pulse 70, height 5\' 2"  (1.575 m), weight 120 lb (54.432 kg), last menstrual period 04/16/2008, SpO2 99 %.   General: No apparent distress alert and oriented x3 mood and affect normal, dressed appropriately.  HEENT: Pupils equal, extraocular movements intact Respiratory: Patient's speak in full sentences and does not appear short of breath Cardiovascular: No lower extremity edema, non tender, no erythema Skin: Warm dry intact with no signs of infection or rash on extremities or on axial skeleton. Abdomen: Soft nontender Neuro: Cranial nerves II through XII are intact, neurovascularly intact in all extremities with 2+ DTRs and 2+ pulses. Lymph: No lymphadenopathy of posterior or anterior cervical chain or axillae bilaterally.  Gait normal with good balance and coordination.  MSK: Non tender with full range of motion and good stability and symmetric strength and tone of shoulders, elbows, wrist,  knee hips and ankles bilaterally.   Osteopathic findings Cervical C7 flexed rotated and side bent left  Thoracic T3 and extended rotated and side bent left inhale rib on left T5 extended rotated and side bent  right  Lumbar L2 flexed rotated and side bent right  Sacrum Right on right  Impression and Recommendations:     This case required medical decision making of moderate complexity.

## 2014-04-19 NOTE — Assessment & Plan Note (Signed)
Patient is doing relatively well with a sacroiliac joint dysfunction. Patient is still able to do activities of daily living and playing golf 3-4 times a week. Patient does respond well to manipulation. Patient does have topical anti-inflammatories. Patient will continue to work on core strengthening exercises and hip abductor muscle imbalances. Patient and will come back and see me again in 6 weeks for further evaluation and treatment.  Spent greater than 25 minutes with patient face-to-face and had greater than 50% of counseling including as described above in assessment and plan.

## 2014-04-19 NOTE — Patient Instructions (Signed)
Good to see you Keep doing what you are doing!!!! Have fun with [facility]. See you again in 6 weeks.

## 2014-04-19 NOTE — Assessment & Plan Note (Signed)
Decision today to treat with OMT was based on Physical Exam  After verbal consent patient was treated with HVLA, ME  techniques in thoracic lumbar and sacral areas  Patient tolerated the procedure well with improvement in symptoms  Patient given exercises, stretches and lifestyle modifications  See medications in patient instructions if given  Patient will follow up in 6 weeks

## 2014-05-31 ENCOUNTER — Ambulatory Visit: Payer: 59 | Admitting: Family Medicine

## 2014-07-21 ENCOUNTER — Other Ambulatory Visit: Payer: Self-pay | Admitting: Family Medicine

## 2014-07-21 ENCOUNTER — Other Ambulatory Visit (HOSPITAL_COMMUNITY)
Admission: RE | Admit: 2014-07-21 | Discharge: 2014-07-21 | Disposition: A | Discharge status: Home / Self Care | Payer: 59 | Source: Ambulatory Visit | Attending: Family Medicine | Admitting: Family Medicine

## 2014-07-21 DIAGNOSIS — Z124 Encounter for screening for malignant neoplasm of cervix: Secondary | ICD-10-CM | POA: Insufficient documentation

## 2014-07-23 LAB — CYTOLOGY - PAP

## 2014-07-30 ENCOUNTER — Encounter: Payer: Self-pay | Admitting: Family Medicine

## 2014-07-30 ENCOUNTER — Ambulatory Visit: Payer: 59 | Admitting: Family Medicine

## 2014-08-05 ENCOUNTER — Ambulatory Visit: Payer: 59 | Admitting: Family Medicine

## 2014-08-23 ENCOUNTER — Ambulatory Visit (INDEPENDENT_AMBULATORY_CARE_PROVIDER_SITE_OTHER): Payer: BLUE CROSS/BLUE SHIELD | Admitting: Family Medicine

## 2014-08-23 ENCOUNTER — Encounter: Payer: Self-pay | Admitting: Family Medicine

## 2014-08-23 ENCOUNTER — Ambulatory Visit (INDEPENDENT_AMBULATORY_CARE_PROVIDER_SITE_OTHER): Payer: BLUE CROSS/BLUE SHIELD

## 2014-08-23 VITALS — BP 118/64 | HR 71 | Ht 62.0 in | Wt 122.0 lb

## 2014-08-23 DIAGNOSIS — M533 Sacrococcygeal disorders, not elsewhere classified: Secondary | ICD-10-CM | POA: Diagnosis not present

## 2014-08-23 DIAGNOSIS — M9903 Segmental and somatic dysfunction of lumbar region: Secondary | ICD-10-CM | POA: Diagnosis not present

## 2014-08-23 DIAGNOSIS — M9904 Segmental and somatic dysfunction of sacral region: Secondary | ICD-10-CM

## 2014-08-23 DIAGNOSIS — M9901 Segmental and somatic dysfunction of cervical region: Secondary | ICD-10-CM | POA: Diagnosis not present

## 2014-08-23 DIAGNOSIS — M999 Biomechanical lesion, unspecified: Secondary | ICD-10-CM

## 2014-08-23 DIAGNOSIS — M7061 Trochanteric bursitis, right hip: Secondary | ICD-10-CM | POA: Diagnosis not present

## 2014-08-23 DIAGNOSIS — M9902 Segmental and somatic dysfunction of thoracic region: Secondary | ICD-10-CM

## 2014-08-23 DIAGNOSIS — M25551 Pain in right hip: Secondary | ICD-10-CM | POA: Diagnosis not present

## 2014-08-23 NOTE — Patient Instructions (Addendum)
Good to see you Dorothy [name] is your friend. Especially in 6 hours Get back to the core exercises 2 times a week at least.  Exercises on wall.  Heel and butt touching.  Raise leg 6 inches and hold 2 seconds.  Down slow for count of 4 seconds.  1 set of 30 reps daily on both sides.  Continue the vitamins Congrat about your son See me again in 2-3 weeks or after the trip

## 2014-08-23 NOTE — Assessment & Plan Note (Signed)
Decision today to treat with OMT was based on Physical Exam  After verbal consent patient was treated with HVLA, ME  techniques in thoracic lumbar and sacral areas  Patient tolerated the procedure well with improvement in symptoms  Patient given exercises, stretches and lifestyle modifications  See medications in patient instructions if given  Patient will follow up in 3 weeks

## 2014-08-23 NOTE — Progress Notes (Signed)
Pre visit review using our clinic review tool, if applicable. No additional management support is needed unless otherwise documented below in the visit note. 

## 2014-08-23 NOTE — Assessment & Plan Note (Signed)
Patient responded very well to manipulation today. We discussed icing regimen and home exercises. Patient is going then come back and see me again in 3 weeks. At that time we'll continue with the manipulation may advance patient with more aggressive home exercises.

## 2014-08-23 NOTE — Progress Notes (Signed)
CC: Follow up for SI joint and manipulation.   HPI: Patient was seen previously and diagnosed sacroiliac joint disease. Patient has not been seen for 4 months. Patient was having difficult he finding a job which did limit some of her insurance. Patient states overall though she has been doing relatively well. Patient has not been taking care of herself. Patient is now working on a more regular basis again and this is taken more time away from taking care of herself. Patient has been very active but is having difficulty doing any weightlifting or the home exercises that was helping her back.  Review of Systems: No headache, visual changes, nausea, vomiting, diarrhea, constipation, dizziness, abdominal pain, skin rash, fevers, chills, night sweats, weight loss, swollen lymph nodes, body aches, joint swelling, muscle aches, chest pain, shortness of breath, mood changes.   Objective:    Blood pressure 118/64, pulse 71, height 5\' 2"  (1.575 m), weight 122 lb (55.339 kg), last menstrual period 04/16/2008, SpO2 98 %.   General: No apparent distress alert and oriented x3 mood and affect normal, dressed appropriately.  HEENT: Pupils equal, extraocular movements intact Respiratory: Patient's speak in full sentences and does not appear short of breath Cardiovascular: No lower extremity edema, non tender, no erythema Skin: Warm dry intact with no signs of infection or rash on extremities or on axial skeleton. Abdomen: Soft nontender Neuro: Cranial nerves II through XII are intact, neurovascularly intact in all extremities with 2+ DTRs and 2+ pulses. Lymph: No lymphadenopathy of posterior or anterior cervical chain or axillae bilaterally.  Gait normal with good balance and coordination.  MSK: Non tender with full range of motion and good stability and symmetric strength and tone of shoulders, elbows, wrist,  knee hips and ankles bilaterally.  Patient does have some tenderness over the right greater  trochanteric area.  Osteopathic findings Cervical C7 flexed rotated and side bent left  Thoracic T3 and extended rotated and side bent left inhale rib on left T5 extended rotated and side bent right  Lumbar L2 flexed rotated and side bent right  Sacrum Right on right  MSK US performed of: Right This study was ordered, performed, and interpreted by Charlann Boxer D.O.  Hip: Trochanteric bursa with significant hypoechoic changes and swelling Acetabular labrum visualized and without tears, displacement, or effusion in joint. Femoral neck appears unremarkable without increased power doppler signal along Cortex.  IMPRESSION:  Greater trochanter bursitis   Procedure: Real-time Ultrasound Guided Injection of right greater trochanteric bursitis secondary to patient's body habitus Device: GE Logiq E  Ultrasound guided injection is preferred based studies that show increased duration, increased effect, greater accuracy, decreased procedural pain, increased response rate, and decreased cost with ultrasound guided versus blind injection.  Verbal informed consent obtained.  Time-out conducted.  Noted no overlying erythema, induration, or other signs of local infection.  Skin prepped in a sterile fashion.  Local anesthesia: Topical Ethyl chloride.  With sterile technique and under real time ultrasound guidance:  Greater trochanteric area was visualized and patient's bursa was noted. A 22-gauge 3 inch needle was inserted and 4 cc of 0.5% Marcaine and 1 cc of Kenalog 40 mg/dL was injected. Pictures taken Completed without difficulty  Pain immediately resolved suggesting accurate placement of the medication.  Advised to call if fevers/chills, erythema, induration, drainage, or persistent bleeding.  Images permanently stored and available for review in the ultrasound unit.  Impression: Technically successful ultrasound guided injection.    Impression and Recommendations:  This case required  medical decision making of moderate complexity.

## 2014-08-23 NOTE — Assessment & Plan Note (Signed)
Patient was given an injection today and tolerated the procedure very well. We discussed icing regimen and home exercises. We discussed hip abductor strengthening exercises. Patient and will come back and see me again in 3 weeks for further evaluation and treatment. Differential also includes patient's piriformis syndrome as well as a gluteal tendon tendinitis.

## 2014-09-06 ENCOUNTER — Ambulatory Visit: Payer: BLUE CROSS/BLUE SHIELD | Admitting: Family Medicine

## 2015-02-17 ENCOUNTER — Ambulatory Visit (INDEPENDENT_AMBULATORY_CARE_PROVIDER_SITE_OTHER): Payer: BLUE CROSS/BLUE SHIELD | Admitting: Family Medicine

## 2015-02-17 ENCOUNTER — Encounter: Payer: Self-pay | Admitting: Family Medicine

## 2015-02-17 VITALS — BP 116/80 | HR 64 | Ht 62.0 in | Wt 123.0 lb

## 2015-02-17 DIAGNOSIS — M9901 Segmental and somatic dysfunction of cervical region: Secondary | ICD-10-CM

## 2015-02-17 DIAGNOSIS — M9904 Segmental and somatic dysfunction of sacral region: Secondary | ICD-10-CM | POA: Diagnosis not present

## 2015-02-17 DIAGNOSIS — M999 Biomechanical lesion, unspecified: Secondary | ICD-10-CM

## 2015-02-17 DIAGNOSIS — M533 Sacrococcygeal disorders, not elsewhere classified: Secondary | ICD-10-CM

## 2015-02-17 DIAGNOSIS — M9903 Segmental and somatic dysfunction of lumbar region: Secondary | ICD-10-CM

## 2015-02-17 NOTE — Assessment & Plan Note (Addendum)
Decision today to treat with OMT was based on Physical Exam  After verbal consent patient was treated with HVLA, ME  techniques in thoracic lumbar and sacral areas  Patient tolerated the procedure well with improvement in symptoms  Patient given exercises, stretches and lifestyle modifications  See medications in patient instructions if given  Patient will follow up in 8-12 weeks

## 2015-02-17 NOTE — Progress Notes (Signed)
Pre visit review using our clinic review tool, if applicable. No additional management support is needed unless otherwise documented below in the visit note. 

## 2015-02-17 NOTE — Assessment & Plan Note (Signed)
Patient overall seems to be doing remarkably better. Encourage her to continue to stay active. Patient will start working on core and is going to be working on a more regular basis. I think that this will be beneficial. Patient given other self manipulation techniques that can be helpful. Patient will come back and see me again in 2-3 months for further evaluation.

## 2015-02-17 NOTE — Patient Instructions (Signed)
Good to see you Continue what you are dong, I am proud of you! Ice is your friend when you need it.  2 tennis balls in a tube sock and lay on them where head meets neck when tightness See me again 2-3 months

## 2015-02-17 NOTE — Progress Notes (Signed)
  CC: Follow up for SI joint and manipulation.   HPI: Patient was seen previously and diagnosed sacroiliac joint disease. Patient has not been seen for 6 months. Patient has felt some increasing tightness. Patient has been able to continue to do other activities but has started working out at a gym and noticed some mild increasing in discomfort. Patient states that the left side of her hip seems to be minorly worse as well. Patient though denies any radiation down her legs any numbness. Nothing that is waking her up at night. Overall patient remains active and able to do all activities of daily living..  Review of Systems: No headache, visual changes, nausea, vomiting, diarrhea, constipation, dizziness, abdominal pain, skin rash, fevers, chills, night sweats, weight loss, swollen lymph nodes, body aches, joint swelling, muscle aches, chest pain, shortness of breath, mood changes.   Objective:    Blood pressure 116/80, pulse 64, height 5\' 2"  (1.575 m), weight 123 lb (55.792 kg), last menstrual period 04/16/2008, SpO2 96 %.   General: No apparent distress alert and oriented x3 mood and affect normal, dressed appropriately.  HEENT: Pupils equal, extraocular movements intact Respiratory: Patient's speak in full sentences and does not appear short of breath Cardiovascular: No lower extremity edema, non tender, no erythema Skin: Warm dry intact with no signs of infection or rash on extremities or on axial skeleton. Abdomen: Soft nontender Neuro: Cranial nerves II through XII are intact, neurovascularly intact in all extremities with 2+ DTRs and 2+ pulses. Lymph: No lymphadenopathy of posterior or anterior cervical chain or axillae bilaterally.  Gait normal with good balance and coordination.  MSK: Non tender with full range of motion and good stability and symmetric strength and tone of shoulders, elbows, wrist,  knee hips and ankles bilaterally.    Osteopathic findings Cervical C2 flexed rotated  and side bent left C7 flexed rotated and side bent left  Thoracic T3 and extended rotated and side bent left inhale rib on left T5 extended rotated and side bent right  Lumbar L2 flexed rotated and side bent right  Sacrum Right on right    Impression and Recommendations:     This case required medical decision making of moderate complexity.

## 2015-03-13 ENCOUNTER — Encounter: Payer: Self-pay | Admitting: Family Medicine

## 2015-03-14 ENCOUNTER — Encounter: Payer: Self-pay | Admitting: Family Medicine

## 2015-03-14 ENCOUNTER — Ambulatory Visit (INDEPENDENT_AMBULATORY_CARE_PROVIDER_SITE_OTHER): Payer: BLUE CROSS/BLUE SHIELD | Admitting: Family Medicine

## 2015-03-14 VITALS — BP 138/80 | HR 68 | Ht 62.0 in | Wt 126.0 lb

## 2015-03-14 DIAGNOSIS — M9903 Segmental and somatic dysfunction of lumbar region: Secondary | ICD-10-CM | POA: Diagnosis not present

## 2015-03-14 DIAGNOSIS — M999 Biomechanical lesion, unspecified: Secondary | ICD-10-CM

## 2015-03-14 DIAGNOSIS — M9902 Segmental and somatic dysfunction of thoracic region: Secondary | ICD-10-CM | POA: Diagnosis not present

## 2015-03-14 DIAGNOSIS — M533 Sacrococcygeal disorders, not elsewhere classified: Secondary | ICD-10-CM

## 2015-03-14 DIAGNOSIS — M9901 Segmental and somatic dysfunction of cervical region: Secondary | ICD-10-CM | POA: Diagnosis not present

## 2015-03-14 DIAGNOSIS — M9904 Segmental and somatic dysfunction of sacral region: Secondary | ICD-10-CM

## 2015-03-14 DIAGNOSIS — M7661 Achilles tendinitis, right leg: Secondary | ICD-10-CM | POA: Diagnosis not present

## 2015-03-14 DIAGNOSIS — M6788 Other specified disorders of synovium and tendon, other site: Secondary | ICD-10-CM | POA: Insufficient documentation

## 2015-03-14 NOTE — Progress Notes (Signed)
CC: Follow up for SI joint and manipulation.   HPI: Patient was seen previously and diagnosed sacroiliac joint disease. Patient was last seen greater than 3 weeks ago. Patient did respond fairly well to osteopathic manipulation and was to start doing more increasing exercises. Patient did have topical anti-inflammatory's. We discussed natural supplementations. Patient states she had been doing relatively well but is having some mild increasing left back pain. Patient had injury to her right ankle. Since then has had this exacerbation. No radiation down the leg. No weakness.  Right ankle- patient was doing a cardio classes and had a severe pain on the posterior aspect of the heel. States that she was able to walk but had significant pain. Still gives her more pain with walking but slowly improving. At approximately 1 week ago. They seem to exacerbate her back. Denies numbness. No weakness. Still able to move ankle fine.  Review of Systems: No headache, visual changes, nausea, vomiting, diarrhea, constipation, dizziness, abdominal pain, skin rash, fevers, chills, night sweats, weight loss, swollen lymph nodes, body aches, joint swelling, muscle aches, chest pain, shortness of breath, mood changes.   Objective:    Blood pressure 138/80, pulse 68, height 5\' 2"  (1.575 m), weight 126 lb (57.153 kg), last menstrual period 04/16/2008, SpO2 99 %.   General: No apparent distress alert and oriented x3 mood and affect normal, dressed appropriately.  HEENT: Pupils equal, extraocular movements intact Respiratory: Patient's speak in full sentences and does not appear short of breath Cardiovascular: No lower extremity edema, non tender, no erythema Skin: Warm dry intact with no signs of infection or rash on extremities or on axial skeleton. Abdomen: Soft nontender Neuro: Cranial nerves II through XII are intact, neurovascularly intact in all extremities with 2+ DTRs and 2+ pulses. Lymph: No lymphadenopathy of  posterior or anterior cervical chain or axillae bilaterally.  Gait normal with good balance and coordination.  MSK: Non tender with full range of motion and good stability and symmetric strength and tone of shoulders, elbows, wrist,  knee hips bilaterally.   Ankle: Right No visible erythema or swelling. Range of motion is full in all directions. Strength is 5/5 in all directions. Stable lateral and medial ligaments; squeeze test and kleiger test unremarkable; Talar dome nontender; No pain at base of 5th MT; No tenderness over cuboid; No tenderness over N spot or navicular prominence No tenderness on posterior aspects of lateral and medial malleolus Achilles tendon to palpation. No defect noted. Negative Thompson. Negative tarsal tunnel tinel's Able to walk 4 steps. Contralateral ankle unremarkable.   Osteopathic findings Cervical C2 flexed rotated and side bent left C7 flexed rotated and side bent left  Thoracic T3 and extended rotated and side bent left inhale rib on left T5 extended rotated and side bent right  Lumbar L2 flexed rotated and side bent right  Sacrum Right on right  Procedure notes 97110; 15 minutes spent for Therapeutic exercises as stated in above notes.  This included exercises focusing on stretching, strengthening, with significant focus on eccentric aspects. Ankle strengthening that included:  Basic range of motion exercises to allow proper full motion at ankle Stretching of the lower leg and hamstrings  Theraband exercises for the lower leg - inversion, eversion, dorsiflexion and plantarflexion each to be completed with a theraband Balance exercises to increase proprioception Weight bearing exercises to increase strength and balance  Proper technique shown and discussed handout in great detail with ATC.  All questions were discussed and answered.  Impression and Recommendations:     This case required medical decision making of moderate  complexity.

## 2015-03-14 NOTE — Progress Notes (Signed)
Pre visit review using our clinic review tool, if applicable. No additional management support is needed unless otherwise documented below in the visit note. 

## 2015-03-14 NOTE — Assessment & Plan Note (Signed)
Patient does have more of a tendinosis. There is a possibility for a small tear but nothing complete. Patient will avoid any high impact or jumping exercises. Patient given a lift today, we discussed home exercises and patient work with Product/process development scientist. We discussed which activities to do an which ones to potentially avoid. Patient come back and see me again in 3-4 weeks for further evaluation and at that time we will ultrasound.

## 2015-03-14 NOTE — Assessment & Plan Note (Signed)
Decision today to treat with OMT was based on Physical Exam  After verbal consent patient was treated with HVLA, ME  techniques in thoracic lumbar and sacral areas  Patient tolerated the procedure well with improvement in symptoms  Patient given exercises, stretches and lifestyle modifications  See medications in patient instructions if given  Patient will follow up in 3-4 weeks       

## 2015-03-14 NOTE — Patient Instructions (Signed)
Good to see you Stay active but no running or jumping Ice is better than heat for the ankle Exercises for the achilles 3 times a week See me again in 3 weeks I think the back is compensation but we will manipulate you again at that time.

## 2015-03-14 NOTE — Assessment & Plan Note (Signed)
Patient overall had been doing very well. I think she was compensating for her new Achilles tendinosis. We discussed icing regimen, home exercises, which activities to do. Patient will start working on core and avoid any high impact exercises. Patient come back in 3-4 weeks for further evaluation and treatment.

## 2015-03-15 ENCOUNTER — Other Ambulatory Visit: Payer: Self-pay | Admitting: Family Medicine

## 2015-03-15 NOTE — Telephone Encounter (Signed)
Refill done.  

## 2015-04-07 ENCOUNTER — Ambulatory Visit (INDEPENDENT_AMBULATORY_CARE_PROVIDER_SITE_OTHER): Payer: BLUE CROSS/BLUE SHIELD | Admitting: Family Medicine

## 2015-04-07 ENCOUNTER — Encounter: Payer: Self-pay | Admitting: Family Medicine

## 2015-04-07 VITALS — BP 124/80 | HR 58 | Ht 62.0 in | Wt 123.0 lb

## 2015-04-07 DIAGNOSIS — M999 Biomechanical lesion, unspecified: Secondary | ICD-10-CM

## 2015-04-07 DIAGNOSIS — M9901 Segmental and somatic dysfunction of cervical region: Secondary | ICD-10-CM

## 2015-04-07 DIAGNOSIS — M7661 Achilles tendinitis, right leg: Secondary | ICD-10-CM | POA: Diagnosis not present

## 2015-04-07 DIAGNOSIS — M545 Low back pain: Secondary | ICD-10-CM | POA: Diagnosis not present

## 2015-04-07 DIAGNOSIS — G8929 Other chronic pain: Secondary | ICD-10-CM

## 2015-04-07 DIAGNOSIS — M9903 Segmental and somatic dysfunction of lumbar region: Secondary | ICD-10-CM | POA: Diagnosis not present

## 2015-04-07 DIAGNOSIS — M9904 Segmental and somatic dysfunction of sacral region: Secondary | ICD-10-CM | POA: Diagnosis not present

## 2015-04-07 NOTE — Assessment & Plan Note (Signed)
Decision today to treat with OMT was based on Physical Exam  After verbal consent patient was treated with HVLA, ME  techniques in thoracic lumbar and sacral areas  Patient tolerated the procedure well with improvement in symptoms  Patient given exercises, stretches and lifestyle modifications  See medications in patient instructions if given  Patient will follow up in 3-4 weeks       

## 2015-04-07 NOTE — Assessment & Plan Note (Signed)
Patient given Cam Walker today. We discussed which activities to avoid. Patient will still remain active. Patient will come back and see me again in 4 weeks. Patient will avoid's the exercises for the next 2 weeks. Patient will do the topical anti-inflammatory, and icing much more regularly.

## 2015-04-07 NOTE — Progress Notes (Signed)
Pre visit review using our clinic review tool, if applicable. No additional management support is needed unless otherwise documented below in the visit note. 

## 2015-04-07 NOTE — Assessment & Plan Note (Signed)
Continues to respond fairly well to conservative therapy. Patient wearing more heels though I am concerned and patient may have some increasing malalignment 91 to increase frequency of office visits. Patient encouraged to continue with core strengthening exercises and upper body. Patient come back and see me again in 4 weeks for further evaluation and treatment.

## 2015-04-07 NOTE — Patient Instructions (Signed)
Duexis 1 pill 3 times a day for 3 days Ice 20 minutes 2 times daily. Usually after activity and before bed. Wear boot at home.  pennsaid pinkie amount topically 2 times daily  See me again in 2-3 weeks.  Stop our exercises

## 2015-04-07 NOTE — Progress Notes (Signed)
  CC: Follow up for SI joint and manipulation.   HPI: Patient was seen previously and diagnosed sacroiliac joint disease. . Patient did have some back pain and has been responding very well to sacroiliac joint manipulation. Patient is a topical anti-inflammatory that she uses regularly. Patient has remain active working out on a very regular basis. Patient states back is doing relatively well. Patient is concerned that she is compensating for her leg and ankle pain. Patient though has been doing the exercises fairly regularly and is feeling better. No excessive amount of pain any time and nothing that is stopping her from regular daily activities.  Right ankle- has Achilles tendinosis. Been doing the exercises regular. Still having pain though. Seems to be may be a little worse in certain circumstances. Denies any numbness or tingling. States that it seems to be doing well. Patient denies any snapping but has been avoiding any type of jumping. Continues to work out but not playing any tennis. Wearing the brace that was given to her. Not doing the home icing or topical anti-inflammatory regularly.  Review of Systems: No headache, visual changes, nausea, vomiting, diarrhea, constipation, dizziness, abdominal pain, skin rash, fevers, chills, night sweats, weight loss, swollen lymph nodes, body aches, joint swelling, muscle aches, chest pain, shortness of breath, mood changes.   Objective:    Blood pressure 124/80, pulse 58, height 5\' 2"  (1.575 m), weight 123 lb (55.792 kg), last menstrual period 04/16/2008, SpO2 97 %.   General: No apparent distress alert and oriented x3 mood and affect normal, dressed appropriately.  HEENT: Pupils equal, extraocular movements intact Respiratory: Patient's speak in full sentences and does not appear short of breath Cardiovascular: No lower extremity edema, non tender, no erythema Skin: Warm dry intact with no signs of infection or rash on extremities or on axial  skeleton. Abdomen: Soft nontender Neuro: Cranial nerves II through XII are intact, neurovascularly intact in all extremities with 2+ DTRs and 2+ pulses. Lymph: No lymphadenopathy of posterior or anterior cervical chain or axillae bilaterally.  Gait normal with good balance and coordination.  MSK: Non tender with full range of motion and good stability and symmetric strength and tone of shoulders, elbows, wrist,  knee hips bilaterally.   Ankle: Right No visible erythema or swelling. Range of motion is full in all directions. Strength is 5/5 in all directions. Stable lateral and medial ligaments; squeeze test and kleiger test unremarkable; Talar dome nontender; No pain at base of 5th MT; No tenderness over cuboid; No tenderness over N spot or navicular prominence No tenderness on posterior aspects of lateral and medial malleolus Achilles tendon tender to palpation. No defect noted. Negative Thompson. Negative tarsal tunnel tinel's Able to walk 4 steps. Contralateral ankle unremarkable.   Osteopathic findings Cervical C2 flexed rotated and side bent left C4 flexed rotated and side bent right C7 flexed rotated and side bent left  Thoracic T3 and extended rotated and side bent left inhale rib on left T5 extended rotated and side bent right  Lumbar L2 flexed rotated and side bent right  Sacrum Right on right  .  Impression and Recommendations:     This case required medical decision making of moderate complexity.

## 2015-05-12 ENCOUNTER — Ambulatory Visit (INDEPENDENT_AMBULATORY_CARE_PROVIDER_SITE_OTHER): Payer: BLUE CROSS/BLUE SHIELD | Admitting: Family Medicine

## 2015-05-12 ENCOUNTER — Encounter: Payer: Self-pay | Admitting: Family Medicine

## 2015-05-12 ENCOUNTER — Other Ambulatory Visit (INDEPENDENT_AMBULATORY_CARE_PROVIDER_SITE_OTHER): Payer: BLUE CROSS/BLUE SHIELD

## 2015-05-12 VITALS — BP 120/80 | HR 62 | Wt 126.0 lb

## 2015-05-12 DIAGNOSIS — G5761 Lesion of plantar nerve, right lower limb: Secondary | ICD-10-CM

## 2015-05-12 DIAGNOSIS — M7661 Achilles tendinitis, right leg: Secondary | ICD-10-CM

## 2015-05-12 DIAGNOSIS — M9903 Segmental and somatic dysfunction of lumbar region: Secondary | ICD-10-CM | POA: Diagnosis not present

## 2015-05-12 DIAGNOSIS — M545 Low back pain, unspecified: Secondary | ICD-10-CM

## 2015-05-12 DIAGNOSIS — M9904 Segmental and somatic dysfunction of sacral region: Secondary | ICD-10-CM

## 2015-05-12 DIAGNOSIS — G8929 Other chronic pain: Secondary | ICD-10-CM

## 2015-05-12 DIAGNOSIS — M216X1 Other acquired deformities of right foot: Secondary | ICD-10-CM | POA: Diagnosis not present

## 2015-05-12 DIAGNOSIS — M999 Biomechanical lesion, unspecified: Secondary | ICD-10-CM

## 2015-05-12 DIAGNOSIS — M9902 Segmental and somatic dysfunction of thoracic region: Secondary | ICD-10-CM

## 2015-05-12 NOTE — Assessment & Plan Note (Signed)
Conjoining to the new diagnosis of a Morton's neuroma. We discussed the importance of a metatarsal pad in proper shoes. Patient will try icing and topical anti-inflammatories and patient will come back and see me again in 3 weeks for further evaluation if not improved we'll consider injection.

## 2015-05-12 NOTE — Assessment & Plan Note (Signed)
Decision today to treat with OMT was based on Physical Exam  After verbal consent patient was treated with HVLA, ME  techniques in thoracic lumbar and sacral areas  Patient tolerated the procedure well with improvement in symptoms  Patient given exercises, stretches and lifestyle modifications  See medications in patient instructions if given  Patient will follow up in 6-8 weeks

## 2015-05-12 NOTE — Patient Instructions (Addendum)
[employer]co orthotics "tptal support"  Would be great or a orthotic with metatarsal pad.  Ice for the foot now [employer].  Pennsaid on the foot See me again in 6 weeks.

## 2015-05-12 NOTE — Progress Notes (Signed)
  CC: Follow up for SI joint and manipulation. Follow-up Achilles tendinosis HPI: Patient was seen previously and diagnosed sacroiliac joint disease. . Patient has been responding to home exercises. In doing well with the core strengthening. Working on a very regular basis..  Right ankle- has Achilles tendinosis. Continues to do the exercises.. For the brace. States that it feels 90% better. Not having any significant pain. No numbness any inflammation.  Patient has noticed so some foot pain. States it is more between the second and third toes. Can have more of a numbness. Seems to be worse with walking long distances or certain shoes. Seems to get better at rest. No true injury  Review of Systems: No headache, visual changes, nausea, vomiting, diarrhea, constipation, dizziness, abdominal pain, skin rash, fevers, chills, night sweats, weight loss, swollen lymph nodes, body aches, joint swelling, muscle aches, chest pain, shortness of breath, mood changes.   Objective:    Blood pressure 120/80, pulse 62, weight 126 lb (57.153 kg), last menstrual period 04/16/2008, SpO2 97 %.   General: No apparent distress alert and oriented x3 mood and affect normal, dressed appropriately.  HEENT: Pupils equal, extraocular movements intact Respiratory: Patient's speak in full sentences and does not appear short of breath Cardiovascular: No lower extremity edema, non tender, no erythema Skin: Warm dry intact with no signs of infection or rash on extremities or on axial skeleton. Abdomen: Soft nontender Neuro: Cranial nerves II through XII are intact, neurovascularly intact in all extremities with 2+ DTRs and 2+ pulses. Lymph: No lymphadenopathy of posterior or anterior cervical chain or axillae bilaterally.  Gait normal with good balance and coordination.  MSK: Non tender with full range of motion and good stability and symmetric strength and tone of shoulders, elbows, wrist,  knee hips bilaterally.   Ankle:  Right No visible erythema or swelling. Range of motion is full in all directions. Strength is 5/5 in all directions. Stable lateral and medial ligaments; squeeze test and kleiger test unremarkable; Talar dome nontender; No pain at base of 5th MT; No tenderness over cuboid; No tenderness over N spot or navicular prominence No tenderness on posterior aspects of lateral and medial malleolus Nontender over the Achilles tendon Negative tarsal tunnel tinel's Able to walk 4 steps. Contralateral ankle unremarkable. Contralateral ankle unremarkable  Foot exam shows the patient does have loss of the transverse arch bilaterally left greater than right. Patient though does have a positive squeeze test. Tenderness between the second and third metatarsal bones. No pain on the bones themselves. Neurovascularly intact distally.  Osteopathic findings Cervical C2 flexed rotated and side bent left C7 flexed rotated and side bent left  Thoracic T3 and extended rotated and side bent left inhale rib on left T5 extended rotated and side bent right  Lumbar L2 flexed rotated and side bent right  Sacrum Right on right  .  Impression and Recommendations:     This case required medical decision making of moderate complexity.

## 2015-05-12 NOTE — Assessment & Plan Note (Signed)
Patient overall seems to be doing well. Does have the piriformis syndrome as well as the sacroiliac joint. I think patient will do well. We discussed icing regimen. We discussed continuing the course to was a shoe which patient has made significant improvement. Patient will see me again in 6 weeks for further evaluation and treatment.

## 2015-07-22 ENCOUNTER — Other Ambulatory Visit (HOSPITAL_COMMUNITY)
Admission: RE | Admit: 2015-07-22 | Discharge: 2015-07-22 | Disposition: A | Discharge status: Home / Self Care | Payer: BLUE CROSS/BLUE SHIELD | Source: Ambulatory Visit | Attending: Family Medicine | Admitting: Family Medicine

## 2015-07-22 ENCOUNTER — Other Ambulatory Visit: Payer: Self-pay | Admitting: Family Medicine

## 2015-07-22 DIAGNOSIS — Z124 Encounter for screening for malignant neoplasm of cervix: Secondary | ICD-10-CM | POA: Insufficient documentation

## 2015-07-22 DIAGNOSIS — Z0000 Encounter for general adult medical examination without abnormal findings: Secondary | ICD-10-CM | POA: Diagnosis not present

## 2015-07-26 LAB — CYTOLOGY - PAP

## 2015-08-03 DIAGNOSIS — Z1211 Encounter for screening for malignant neoplasm of colon: Secondary | ICD-10-CM | POA: Diagnosis not present

## 2015-09-06 DIAGNOSIS — L243 Irritant contact dermatitis due to cosmetics: Secondary | ICD-10-CM | POA: Diagnosis not present

## 2015-12-06 NOTE — Assessment & Plan Note (Signed)
Decision today to treat with OMT was based on Physical Exam  After verbal consent patient was treated with HVLA, ME  techniques in thoracic lumbar and sacral areas  Patient tolerated the procedure well with improvement in symptoms  Patient given exercises, stretches and lifestyle modifications  See medications in patient instructions if given  Patient will follow up in 4-8 weeks

## 2015-12-06 NOTE — Assessment & Plan Note (Signed)
Continues to give her some difficulty. Patient is on topical anti-inflammatories of the wrist revealed today. We discussed icing regimen and home exercises. We discussed which activities doing which ones to avoid. Patient come back and see me again in 4-8 weeks for further evaluation and treatment.

## 2015-12-06 NOTE — Progress Notes (Signed)
  CC: Follow up for SI joint and manipulation. Follow-up Achilles tendinosis HPI: Patient was seen previously and diagnosed sacroiliac joint disease. . Patient has been responding to home exercises. In doing well with the core strengthening  More discomfort of neck and left shoulder.  Worse with sitting.  No radiation, some more stress recently. No weakness, difficult to find comfortable position, still can sleep.     Review of Systems: No headache, visual changes, nausea, vomiting, diarrhea, constipation, dizziness, abdominal pain, skin rash, fevers, chills, night sweats, weight loss, swollen lymph nodes, body aches, joint swelling, muscle aches, chest pain, shortness of breath, mood changes.   Objective:    Blood pressure 122/80, pulse (!) 57, weight 123 lb (55.8 kg), last menstrual period 04/16/2008, SpO2 98 %.   General: No apparent distress alert and oriented x3 mood and affect normal, dressed appropriately.  HEENT: Pupils equal, extraocular movements intact Respiratory: Patient's speak in full sentences and does not appear short of breath Cardiovascular: No lower extremity edema, non tender, no erythema Skin: Warm dry intact with no signs of infection or rash on extremities or on axial skeleton. Abdomen: Soft nontender Neuro: Cranial nerves II through XII are intact, neurovascularly intact in all extremities with 2+ DTRs and 2+ pulses. Lymph: No lymphadenopathy of posterior or anterior cervical chain or axillae bilaterally.  Gait normal with good balance and coordination.  MSK: Non tender with full range of motion and good stability and symmetric strength and tone of shoulders, elbows, wrist,  knee hips bilaterally.  Neck: Inspection unremarkable. No palpable stepoffs. Negative Spurling's maneuver. Full neck range of motion Grip strength and sensation normal in bilateral hands Strength good C4 to T1 distribution No sensory change to C4 to T1 Negative Hoffman sign  bilaterally Reflexes normal Tightness of the trapezius on the left side mild trigger points noted.  Osteopathic findings Cervical C2 flexed rotated and side bent left C7 flexed rotated and side bent left  Thoracic T3 and extended rotated and side bent left inhale rib on left T5 extended rotated and side bent right  Lumbar L2 flexed rotated and side bent right  Sacrum Right on right  .  Impression and Recommendations:     This case required medical decision making of moderate complexity.

## 2015-12-07 ENCOUNTER — Ambulatory Visit (INDEPENDENT_AMBULATORY_CARE_PROVIDER_SITE_OTHER): Payer: BLUE CROSS/BLUE SHIELD | Admitting: Family Medicine

## 2015-12-07 ENCOUNTER — Encounter: Payer: Self-pay | Admitting: Family Medicine

## 2015-12-07 VITALS — BP 122/80 | HR 57 | Wt 123.0 lb

## 2015-12-07 DIAGNOSIS — M9902 Segmental and somatic dysfunction of thoracic region: Secondary | ICD-10-CM | POA: Diagnosis not present

## 2015-12-07 DIAGNOSIS — M999 Biomechanical lesion, unspecified: Secondary | ICD-10-CM

## 2015-12-07 DIAGNOSIS — M533 Sacrococcygeal disorders, not elsewhere classified: Secondary | ICD-10-CM

## 2015-12-07 DIAGNOSIS — M9903 Segmental and somatic dysfunction of lumbar region: Secondary | ICD-10-CM | POA: Diagnosis not present

## 2015-12-07 DIAGNOSIS — M9901 Segmental and somatic dysfunction of cervical region: Secondary | ICD-10-CM | POA: Diagnosis not present

## 2015-12-07 DIAGNOSIS — M62838 Other muscle spasm: Secondary | ICD-10-CM

## 2015-12-07 DIAGNOSIS — M9904 Segmental and somatic dysfunction of sacral region: Secondary | ICD-10-CM

## 2015-12-07 DIAGNOSIS — M6248 Contracture of muscle, other site: Secondary | ICD-10-CM

## 2015-12-07 MED ORDER — TIZANIDINE HCL 4 MG PO TABS: 4.0000 mg | ORAL_TABLET | Freq: Every evening | ORAL | 2 refills | Status: DC

## 2015-12-07 MED ORDER — TIZANIDINE HCL 4 MG PO TABS: 4.0000 mg | ORAL_TABLET | Freq: Every evening | ORAL | 2 refills | Status: AC

## 2015-12-07 NOTE — Assessment & Plan Note (Signed)
Having a little of the trapezius muscle spasm. Patient given a muscle relaxer to take at night if necessary. We discussed icing regimen. Patient come back and see me again in 3-4 weeks for further evaluation.

## 2015-12-07 NOTE — Patient Instructions (Signed)
God to see you  Zanaflex at night for 3 nights pennsaid pinkie amount topically 2 times daily for 5 days.  Ice is friend after activity  Check work environment.  See me again in 3 weeks if not perfect and will do trigger point or see me when you need me.

## 2016-02-15 NOTE — Progress Notes (Signed)
  CC: Follow up for SI joint and manipulation  Patient was last seen greater than a month ago. Was having more of a trapezius muscle spasm and was given muscle relaxers. Patient states more pain in upper thoracic area on left again.  Seems to be staying with her longer.  Patient denies any radiation down the arm or any neck pain associated with it. Seems to stay localized. States that it can wake her up at night. Starting affects some activity such as working out on a regular basis.  No past medical history on file. Past Surgical History:  Procedure Laterality Date  . [place]   Social History  Substance Use Topics  . Smoking status: Never Smoker  . Smokeless tobacco: Never Used  . Alcohol use Yes   No Known Allergies Family History  Problem Relation Age of Onset  . Hypertension Mother   . Cancer Father     BRAIN TUMOR     Review of Systems: No headache, visual changes, nausea, vomiting, diarrhea, constipation, dizziness, abdominal pain, skin rash, fevers, chills, night sweats, weight loss, swollen lymph nodes, body aches, joint swelling, muscle aches, chest pain, shortness of breath, mood changes.   Objective:    Blood pressure 118/68, pulse 69, height 5\' 2"  (1.575 m), weight 122 lb (55.3 kg), last menstrual period 04/16/2008, SpO2 99 %.   Systems examined below as of 02/16/16 General: NAD A&O x3 mood, affect normal  HEENT: Pupils equal, extraocular movements intact no nystagmus Respiratory: not short of breath at rest or with speaking Cardiovascular: No lower extremity edema, non tender Skin: Warm dry intact with no signs of infection or rash on extremities or on axial skeleton. Abdomen: Soft nontender, no masses Neuro: Cranial nerves  intact, neurovascularly intact in all extremities with 2+ DTRs and 2+ pulses. Lymph: No lymphadenopathy appreciated today  Gait normal with good balance and coordination.  MSK: Non tender with full range of motion and good  stability and symmetric strength and tone of shoulders, elbows, wrist,  knee hips and ankles bilaterally.    Neck: Inspection unremarkable. No palpable stepoffs. Negative Spurling's maneuver. Lacks last 5 of rotation and side bending to the left Grip strength and sensation normal in bilateral hands Strength good C4 to T1 distribution No sensory change to C4 to T1 Negative Hoffman sign bilaterally Reflexes normal Tightness of the trapezius on the left side mild trigger points noted minorly worse then last time.   Osteopathic findings Cervical C2 flexed rotated and side bent left C6 flexed rotated and side bent left  Thoracic T3 and extended rotated and side bent left inhale rib on left T7 extended rotated and side bent left  Lumbar L2 flexed rotated and side bent right  Sacrum Right on right  .  Impression and Recommendations:     This case required medical decision making of moderate complexity.

## 2016-02-16 ENCOUNTER — Encounter: Payer: Self-pay | Admitting: Family Medicine

## 2016-02-16 ENCOUNTER — Ambulatory Visit (INDEPENDENT_AMBULATORY_CARE_PROVIDER_SITE_OTHER): Payer: BLUE CROSS/BLUE SHIELD | Admitting: Family Medicine

## 2016-02-16 VITALS — BP 118/68 | HR 69 | Ht 62.0 in | Wt 122.0 lb

## 2016-02-16 DIAGNOSIS — M62838 Other muscle spasm: Secondary | ICD-10-CM | POA: Diagnosis not present

## 2016-02-16 DIAGNOSIS — M533 Sacrococcygeal disorders, not elsewhere classified: Secondary | ICD-10-CM

## 2016-02-16 DIAGNOSIS — M999 Biomechanical lesion, unspecified: Secondary | ICD-10-CM | POA: Diagnosis not present

## 2016-02-16 NOTE — Assessment & Plan Note (Signed)
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 We discussed posture and ergonomics. We discussed using the muscle relaxer if needed. Patient will follow-up with me again in 3 weeks for further evaluation. Possible trigger points.

## 2016-02-16 NOTE — Patient Instructions (Signed)
Good to see you  Happy holidays! Keep working on the posture.  On wall with heels, butt shoulder and head touching for a goal of 5 minutes daily  Tennis ball between shoulder blades with sitting a lot. Ice is your friend  See me again in 3 weeks. (wed 745am)

## 2016-02-16 NOTE — Assessment & Plan Note (Signed)
Decision today to treat with OMT was based on Physical Exam  After verbal consent patient was treated with HVLA, ME  techniques in thoracic lumbar and sacral areas  Patient tolerated the procedure well with improvement in symptoms  Patient given exercises, stretches and lifestyle modifications  See medications in patient instructions if given  Patient will follow up in 3 weeks           

## 2016-02-16 NOTE — Assessment & Plan Note (Signed)
Doing well overall. Discussed hip abductor and core strengthening. Patient is doing very well.

## 2016-03-06 NOTE — Progress Notes (Signed)
  CC: Follow up for SI joint and manipulation  Patient is having some mild increase in muscle tightness of the trapezius muscle. Patient was given  muscle relaxer. Encourage her to work on core and ergonomics as well as posture. Patient states overall she think she has been improving. Has been playing more golf. Some mild increase in tenderness over the course of the last several weeks. Patient was given a muscle relaxer that she has not taken regularly. Patient states that overall does think that she is improving slowly but is having some more discomfort over the course last several days. Has responded well to manipulation previously.  No past medical history on file. Past Surgical History:  Procedure Laterality Date  . [place]   Social History  Substance Use Topics  . Smoking status: Never Smoker  . Smokeless tobacco: Never Used  . Alcohol use Yes   No Known Allergies Family History  Problem Relation Age of Onset  . Hypertension Mother   . Cancer Father     BRAIN TUMOR     Review of Systems: No headache, visual changes, nausea, vomiting, diarrhea, constipation, dizziness, abdominal pain, skin rash, fevers, chills, night sweats, weight loss, swollen lymph nodes, body aches, joint swelling, muscle aches, chest pain, shortness of breath, mood changes.   Objective:    Blood pressure 110/78, pulse 67, height 5' 1.5" (1.562 m), weight 122 lb (55.3 kg), last menstrual period 04/16/2008, SpO2 99 %.   Systems examined below as of 03/07/16 General: NAD A&O x3 mood, affect normal  HEENT: Pupils equal, extraocular movements intact no nystagmus Respiratory: not short of breath at rest or with speaking Cardiovascular: No lower extremity edema, non tender Skin: Warm dry intact with no signs of infection or rash on extremities or on axial skeleton. Abdomen: Soft nontender, no masses Neuro: Cranial nerves  intact, neurovascularly intact in all extremities with 2+ DTRs and 2+  pulses. Lymph: No lymphadenopathy appreciated today  Gait normal with good balance and coordination.  MSK: Non tender with full range of motion and good stability and symmetric strength and tone of shoulders, elbows, wrist,  knee hips and ankles bilaterally.      Neck: Inspection unremarkable. No palpable stepoffs. Negative Spurling's maneuver.More discomfort than previous exam Lacks last 5 of rotation and side bending to the left Grip strength and sensation normal in bilateral hands Strength good C4 to T1 distribution No sensory change to C4 to T1 Negative Hoffman sign bilaterally Reflexes normal Continued tightness in the left trapezius  Osteopathic findings Cervical C2 flexed rotated and side bent left C5 flexed rotated and side bent left  Thoracic T3 and extended rotated and side bent left inhale rib on left T9 extended rotated and side bent left  Lumbar L2 flexed rotated and side bent right  Sacrum Right on right  .  Impression and Recommendations:     This case required medical decision making of moderate complexity.

## 2016-03-07 ENCOUNTER — Encounter: Payer: Self-pay | Admitting: Family Medicine

## 2016-03-07 ENCOUNTER — Ambulatory Visit (INDEPENDENT_AMBULATORY_CARE_PROVIDER_SITE_OTHER): Payer: BLUE CROSS/BLUE SHIELD | Admitting: Family Medicine

## 2016-03-07 VITALS — BP 110/78 | HR 67 | Ht 61.5 in | Wt 122.0 lb

## 2016-03-07 DIAGNOSIS — M62838 Other muscle spasm: Secondary | ICD-10-CM

## 2016-03-07 DIAGNOSIS — M999 Biomechanical lesion, unspecified: Secondary | ICD-10-CM | POA: Diagnosis not present

## 2016-03-07 NOTE — Patient Instructions (Signed)
Good to see you  Ice is your friend  Keep up with the exercises  See me again ain 4-6 weeks.

## 2016-03-07 NOTE — Assessment & Plan Note (Signed)
Continues to have a spasm. I do believe that this is multifactorial. Likely secondary to more of muscle imbalances, poor posture, as well as repetitive activity. There is some underlying degenerative disc disease of the cervical region and possible nerve root impingement as a possibility. Patient has declined gabapentin. We discussed continuing conservative therapy. Does respond well to osteopathic manipulation. Follow-up again in 4-6 weeks.

## 2016-03-07 NOTE — Assessment & Plan Note (Signed)
Decision today to treat with OMT was based on Physical Exam  After verbal consent patient was treated with HVLA, ME  techniques in thoracic lumbar and sacral areas  Patient tolerated the procedure well with improvement in symptoms  Patient given exercises, stretches and lifestyle modifications  See medications in patient instructions if given  Patient will follow up in 4 weeks

## 2016-03-27 DIAGNOSIS — H5213 Myopia, bilateral: Secondary | ICD-10-CM | POA: Diagnosis not present

## 2016-04-10 NOTE — Progress Notes (Signed)
CC: Follow up for SI joint and manipulation  Patient is having some mild increase in muscle tightness of the trapezius muscle. Patient was given  muscle relaxer. Encourage her to work on core and ergonomics as well as posture. Patient is been doing very well. Unfortunately is having more upper neck pain. Seems to be on the right side. Started on muscle relaxer with some mild benefit. States though that the pain seems to be unresolving. Starting to affect daily activities. Even driving is uncomfortable. Denies any neck pain associated with it.  No past medical history on file.  Patient Active Problem List   Diagnosis Date Noted  . 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 05/12/2015  . Achilles tendinosis 03/14/2015  . Trapezius muscle spasm 03/23/2014  . 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 Nonallopathic lesion of sacral region 07/20/2013  . Greater trochanteric bursitis of right hip 06/24/2013  . Piriformis syndrome of right side 05/25/2013  . Nonallopathic lesion of cervical region 05/11/2013  . Sacroiliac joint dysfunction of right side 04/27/2013  . Strain of flexor muscle of right hip 04/27/2013  . Leg length discrepancy 04/27/2013  . Nonallopathic lesion of lumbosacral region 04/27/2013  . Nonallopathic lesion of thoracic region 04/27/2013  . BACK PAIN, LUMBAR 09/30/2009    Past Surgical History:  Procedure Laterality Date  . [place]   Social History  Substance Use Topics  . Smoking status: Never Smoker  . Smokeless tobacco: Never Used  . Alcohol use Yes   No Known Allergies Family History  Problem Relation Age of Onset  . Hypertension Mother   . Cancer Father     BRAIN TUMOR     Review of Systems: No headache, visual changes, nausea, vomiting, diarrhea, constipation, dizziness, abdominal pain, skin rash, fevers, chills, night sweats, weight loss, swollen lymph nodes, body aches, joint swelling, muscle aches, chest pain, shortness of  breath, mood changes.     Objective:    Blood pressure 122/72, pulse 67, height 5' 1.5" (1.562 m), weight 123 lb (55.8 kg), last menstrual period 04/16/2008, SpO2 99 %.   Systems examined below as of 04/11/16 General: NAD A&O x3 mood, affect normal  HEENT: Pupils equal, extraocular movements intact no nystagmus Respiratory: not short of breath at rest or with speaking Cardiovascular: No lower extremity edema, non tender Skin: Warm dry intact with no signs of infection or rash on extremities or on axial skeleton. Abdomen: Soft nontender, no masses Neuro: Cranial nerves  intact, neurovascularly intact in all extremities with 2+ DTRs and 2+ pulses. Lymph: No lymphadenopathy appreciated today  Gait normal with good balance and coordination.  MSK: Non tender with full range of motion and good stability and symmetric strength and tone of , elbows, wrist,  knee hips and ankles bilaterally.       Neck: Inspection unremarkable. No palpable stepoffs. Negative Spurling's maneuver.More discomfort than previous exam Near full range of motion which is an improvement Grip strength and sensation normal in bilateral hands Strength good C4 to T1 distribution No sensory change to C4 to T1 Negative Hoffman sign bilaterally Reflexes normal Continued tightness in the left trapezius with trigger points  Shoulder: Left Inspection reveals no abnormalities, atrophy or asymmetry. Increasing tightness and discomfort over the left trapezius muscle trigger points noted Rotator cuff strength normal throughout. No signs of impingement with negative Neer and Hawkin's tests, empty can sign. Speeds and Yergason's tests normal. No labral pathology noted with negative Obrien's, negative clunk and  good stability. Normal scapular function observed. No painful arc and no drop arm sign. No apprehension sign Contralateral shoulder unremarkable    Osteopathic findings Cervical C2 flexed rotated and side bent  left C6 flexed rotated and side bent left  Thoracic T3 and extended rotated and side bent left inhale rib on left T8extended rotated and side bent left  Lumbar L2 flexed rotated and side bent right  Sacrum Right on right  Procedure note After verbal consent patient was prepped with alcohol swabs and with a 25-gauge half-inch needle was injected in 3 distinct trigger trigger points in the left trapezius. Total of 3 mL of 0.5% Marcaine and 1 mL of Kenalog 40 mg/dL used. Band-Aid placed. Post injection instructions given. .  Impression and Recommendations:     This case required medical decision making of moderate complexity.

## 2016-04-11 ENCOUNTER — Encounter: Payer: Self-pay | Admitting: Family Medicine

## 2016-04-11 ENCOUNTER — Ambulatory Visit (INDEPENDENT_AMBULATORY_CARE_PROVIDER_SITE_OTHER): Payer: BLUE CROSS/BLUE SHIELD | Admitting: Family Medicine

## 2016-04-11 VITALS — BP 122/72 | HR 67 | Ht 61.5 in | Wt 123.0 lb

## 2016-04-11 DIAGNOSIS — M999 Biomechanical lesion, unspecified: Secondary | ICD-10-CM | POA: Diagnosis not present

## 2016-04-11 DIAGNOSIS — M25512 Pain in left shoulder: Secondary | ICD-10-CM | POA: Insufficient documentation

## 2016-04-11 DIAGNOSIS — G5701 Lesion of sciatic nerve, right lower limb: Secondary | ICD-10-CM | POA: Diagnosis not present

## 2016-04-11 NOTE — Assessment & Plan Note (Signed)
Decision today to treat with OMT was based on Physical Exam  After verbal consent patient was treated with HVLA, ME  techniques in thoracic lumbar and sacral areas  Patient tolerated the procedure well with improvement in symptoms  Patient given exercises, stretches and lifestyle modifications  See medications in patient instructions if given  Patient will follow up in 4 weeks

## 2016-04-11 NOTE — Patient Instructions (Addendum)
Happy New Year! We tried trigger point injection and I hope it helps.  Normal to be a little warm in the area for a day.  Ice is your friend.  Try to keep your hands within peripheral vision.  Keep staying active See me again in 4 weeks.

## 2016-04-11 NOTE — Assessment & Plan Note (Signed)

## 2016-04-11 NOTE — Assessment & Plan Note (Signed)
Turgor point injections given today. Tolerated the procedure well. If no significant improvement we'll started on gabapentin. Did not feel that further workup is necessary. Encourage patient to continue to work on posture, and ergonomics. Follow-up again in 4 weeks.

## 2016-04-17 DIAGNOSIS — L738 Other specified follicular disorders: Secondary | ICD-10-CM | POA: Diagnosis not present

## 2016-04-17 DIAGNOSIS — D1801 Hemangioma of skin and subcutaneous tissue: Secondary | ICD-10-CM | POA: Diagnosis not present

## 2016-04-17 DIAGNOSIS — D225 Melanocytic nevi of trunk: Secondary | ICD-10-CM | POA: Diagnosis not present

## 2016-04-17 DIAGNOSIS — L821 Other seborrheic keratosis: Secondary | ICD-10-CM | POA: Diagnosis not present

## 2016-05-18 DIAGNOSIS — Z1231 Encounter for screening mammogram for malignant neoplasm of breast: Secondary | ICD-10-CM | POA: Diagnosis not present

## 2016-08-10 ENCOUNTER — Other Ambulatory Visit (HOSPITAL_COMMUNITY)
Admission: RE | Admit: 2016-08-10 | Discharge: 2016-08-10 | Disposition: A | Discharge status: Home / Self Care | Payer: BLUE CROSS/BLUE SHIELD | Source: Ambulatory Visit | Attending: Family Medicine | Admitting: Family Medicine

## 2016-08-10 ENCOUNTER — Other Ambulatory Visit: Payer: Self-pay | Admitting: Family Medicine

## 2016-08-10 DIAGNOSIS — Z23 Encounter for immunization: Secondary | ICD-10-CM | POA: Diagnosis not present

## 2016-08-10 DIAGNOSIS — Z124 Encounter for screening for malignant neoplasm of cervix: Secondary | ICD-10-CM | POA: Diagnosis not present

## 2016-08-10 DIAGNOSIS — Z0000 Encounter for general adult medical examination without abnormal findings: Secondary | ICD-10-CM | POA: Diagnosis not present

## 2016-08-12 NOTE — Progress Notes (Signed)
[name] [address] [place], [place] [address] Phone: [phone] Subjective:     CC:  Neck and left shoulder pain f/u  [id number]  Dorothy [name] is a 61 y.o. female coming in with complaint of Neck and left shoulder pain. Patient has been seen previously and has responded well to manipulation. Patient has had her points previously and has done fairly well with injections as well as muscle relaxers. Patient states hasn't plain a lumbar golf recently. Because she has been doing then she has noticed some increasing discomfort again. Patient denies any numbness or tingling. Patient states that it just seems more of a stiffness in the neck going to the left sign. Also has no some bilateral hip pain and has had sacroiliac dysfunction previously. No radiation of the legs. Just worsening of previous symptoms.      No past medical history on file. Past Surgical History:  Procedure Laterality Date  . [place]   Social History   Social History  . Marital status: Married    Spouse name: N/A  . Number of children: N/A  . Years of education: N/A   Social History Main Topics  . Smoking status: Never Smoker  . Smokeless tobacco: Never Used  . Alcohol use Yes  . Drug use: No  . Sexual activity: Yes    Birth control/ protection: Post-menopausal   Other Topics Concern  . Not on file   Social History Narrative  . No narrative on file   No Known Allergies Family History  Problem Relation Age of Onset  . Hypertension Mother   . Cancer Father     BRAIN TUMOR    Past medical history, social, surgical and family history all reviewed in electronic medical record.  No pertanent information unless stated regarding to the chief complaint.   Review of Systems:Review of systems updated and as accurate as of 08/13/16  No headache, visual changes, nausea, vomiting, diarrhea, constipation, dizziness, abdominal pain, skin rash, fevers,  chills, night sweats, weight loss, swollen lymph nodes, body aches, joint swelling,  chest pain, shortness of breath, mood changes.    Objective  Blood pressure 132/68, pulse 61, resp. rate 16, weight 121 lb 2 oz (54.9 kg), last menstrual period 04/16/2008, SpO2 98 %. Systems examined below as of 08/13/16   General: No apparent distress alert and oriented x3 mood and affect normal, dressed appropriately.  HEENT: Pupils equal, extraocular movements intact  Respiratory: Patient's speak in full sentences and does not appear short of breath  Cardiovascular: No lower extremity edema, non tender, no erythema  Skin: Warm dry intact with no signs of infection or rash on extremities or on axial skeleton.  Abdomen: Soft nontender  Neuro: Cranial nerves II through XII are intact, neurovascularly intact in all extremities with 2+ DTRs and 2+ pulses.  Lymph: No lymphadenopathy of posterior or anterior cervical chain or axillae bilaterally.     Gait normal with good balance and coordination.  MSK:  Non tender with full range of motion and good stability and symmetric strength and tone of shoulders, elbows, wrist, hip, knee and ankles bilaterally.  Back Exam:  Inspection: Unremarkable  Motion: Flexion 45 deg, Extension 25 deg, Side Bending to 45 deg bilaterally,  Rotation to 35 deg bilaterally  SLR laying: Negative  XSLR laying: Negative  Palpable tenderness: Tender to palpation over the left sacroiliac joint. FABER: Mild positive left side. Sensory change: Gross sensation intact to all lumbar  and sacral dermatomes.  Reflexes: 2+ at both patellar tendons, 2+ at achilles tendons, Babinski's downgoing.  Strength at foot  Plantar-flexion: 5/5 Dorsi-flexion: 5/5 Eversion: 5/5 Inversion: 5/5  Leg strength  Quad: 5/5 Hamstring: 5/5 Hip flexor: 5/5 Hip abductors: 4+/5 but symmetric Gait unremarkable.  Neck: Inspection unremarkable. No palpable stepoffs. Negative Spurling's maneuver. The last 5 of  rotation to the left Grip strength and sensation normal in bilateral hands Strength good C4 to T1 distribution No sensory change to C4 to T1 Negative Hoffman sign bilaterally Reflexes normal  Osteopathic findings Cervical C2 flexed rotated and side bent right C4 flexed rotated and side bent left C6 flexed rotated and side bent left T3 extended rotated and side bent right inhaled third rib T9 extended rotated and side bent left L2 flexed rotated and side bent right Sacrum right on right     Impression and Recommendations:     This case required medical decision making of moderate complexity.      Note: This dictation was prepared with Dragon dictation along with smaller phrase technology. Any transcriptional errors that result from this process are unintentional.

## 2016-08-13 ENCOUNTER — Encounter: Payer: Self-pay | Admitting: Family Medicine

## 2016-08-13 ENCOUNTER — Ambulatory Visit (INDEPENDENT_AMBULATORY_CARE_PROVIDER_SITE_OTHER): Payer: BLUE CROSS/BLUE SHIELD | Admitting: Family Medicine

## 2016-08-13 VITALS — BP 132/68 | HR 61 | Resp 16 | Wt 121.1 lb

## 2016-08-13 DIAGNOSIS — M999 Biomechanical lesion, unspecified: Secondary | ICD-10-CM | POA: Diagnosis not present

## 2016-08-13 DIAGNOSIS — G8929 Other chronic pain: Secondary | ICD-10-CM | POA: Diagnosis not present

## 2016-08-13 DIAGNOSIS — M533 Sacrococcygeal disorders, not elsewhere classified: Secondary | ICD-10-CM | POA: Diagnosis not present

## 2016-08-13 DIAGNOSIS — M545 Low back pain: Secondary | ICD-10-CM | POA: Diagnosis not present

## 2016-08-13 NOTE — Patient Instructions (Addendum)
Good to see you  Dorothy [name] is your friend.  Continue the muscle relaxer as needed.

## 2016-08-13 NOTE — Assessment & Plan Note (Signed)
Still some mild pain in the lumbar spine as well as over the sacroiliac joint. Discussed with patient at great length about core stability and ergonomics. We discussed icing regimen. Discussed which activities doing which was to avoid. Patient come back and see me again in 4-8 weeks for further evaluation and treatment.

## 2016-08-13 NOTE — Assessment & Plan Note (Signed)
Patient usually has pain on the right side but more on the left side today. We discussed with patient at great length. We discussed icing regimen and home exercises. Discussed which activities to do a which was to avoid.

## 2016-08-13 NOTE — Assessment & Plan Note (Signed)
Decision today to treat with OMT was based on Physical Exam  After verbal consent patient was treated with HVLA, ME, FPR techniques in cervical, thoracic, lumbar and sacral areas  Patient tolerated the procedure well with improvement in symptoms  Patient given exercises, stretches and lifestyle modifications  See medications in patient instructions if given  Patient will follow up in 8 weeks 

## 2016-08-14 LAB — CYTOLOGY - PAP
Adequacy: ABSENT
Diagnosis: NEGATIVE

## 2016-10-29 DIAGNOSIS — M8588 Other specified disorders of bone density and structure, other site: Secondary | ICD-10-CM | POA: Diagnosis not present

## 2016-12-26 ENCOUNTER — Encounter: Payer: Self-pay | Admitting: Family Medicine

## 2016-12-26 ENCOUNTER — Ambulatory Visit (INDEPENDENT_AMBULATORY_CARE_PROVIDER_SITE_OTHER): Payer: BLUE CROSS/BLUE SHIELD | Admitting: Family Medicine

## 2016-12-26 VITALS — BP 122/82 | HR 64 | Ht 61.5 in | Wt 121.0 lb

## 2016-12-26 DIAGNOSIS — M999 Biomechanical lesion, unspecified: Secondary | ICD-10-CM

## 2016-12-26 DIAGNOSIS — M533 Sacrococcygeal disorders, not elsewhere classified: Secondary | ICD-10-CM

## 2016-12-26 NOTE — Assessment & Plan Note (Signed)
Decision today to treat with OMT was based on Physical Exam  After verbal consent patient was treated with HVLA, ME, FPR techniques in cervical, thoracic, lumbar and sacral areas  Patient tolerated the procedure well with improvement in symptoms  Patient given exercises, stretches and lifestyle modifications  See medications in patient instructions if given  Patient will follow up PRN

## 2016-12-26 NOTE — Assessment & Plan Note (Signed)
Mild tenderness noted. Still in the area. We have discussed before trigger point injections. Patient is not taking the gabapentin regularly. Using the topical things needed. Lungs patient does well she continued to follow-up as needed.

## 2016-12-26 NOTE — Progress Notes (Signed)
[name] [address] [place], [place] [address] Phone: [phone] Subjective:     CC:  Neck and left shoulder pain f/u  [id number]  Dorothy [name] is a 61 y.o. female coming in with complaint of Neck and left shoulder pain. was having headaches.  Patient states that it has some tightness there. Some tightness of the lower back in the hip. Nothing severe though. Patient continues to be active. Working out 6-7 days a week. States that more tightness recently that is waking her up even at night.      No past medical history on file. Past Surgical History:  Procedure Laterality Date  . [place]   Social History   Social History  . Marital status: Married    Spouse name: N/A  . Number of children: N/A  . Years of education: N/A   Social History Main Topics  . Smoking status: Never Smoker  . Smokeless tobacco: Never Used  . Alcohol use Yes  . Drug use: No  . Sexual activity: Yes    Birth control/ protection: Post-menopausal   Other Topics Concern  . None   Social History Narrative  . None   No Known Allergies Family History  Problem Relation Age of Onset  . Hypertension Mother   . Cancer Father        BRAIN TUMOR    Past medical history, social, surgical and family history all reviewed in electronic medical record.  No pertanent information unless stated regarding to the chief complaint.   Review of Systems:Review of systems updated and as accurate as of 12/26/16  No headache, visual changes, nausea, vomiting, diarrhea, constipation, dizziness, abdominal pain, skin rash, fevers, chills, night sweats, weight loss, swollen lymph nodes, body aches, joint swelling,  chest pain, shortness of breath, mood changes.    Objective  Blood pressure 122/82, pulse 64, height 5' 1.5" (1.562 m), weight 121 lb (54.9 kg), last menstrual period 04/16/2008, SpO2 99 %. Systems examined below as of 12/26/16   General: No apparent  distress alert and oriented x3 mood and affect normal, dressed appropriately.  HEENT: Pupils equal, extraocular movements intact  Respiratory: Patient's speak in full sentences and does not appear short of breath  Cardiovascular: No lower extremity edema, non tender, no erythema  Skin: Warm dry intact with no signs of infection or rash on extremities or on axial skeleton.  Abdomen: Soft nontender  Neuro: Cranial nerves II through XII are intact, neurovascularly intact in all extremities with 2+ DTRs and 2+ pulses.  Lymph: No lymphadenopathy of posterior or anterior cervical chain or axillae bilaterally.     Gait normal with good balance and coordination.  MSK:  Non tender with full range of motion and good stability and symmetric strength and tone of shoulders, elbows, wrist, hip, knee and ankles bilaterally.  Back Exam:  Inspection: Unremarkable  Motion: Flexion 45 deg, Extension 25 deg, Side Bending to 45 deg bilaterally,  Rotation to 35 deg bilaterally  SLR laying: Negative  XSLR laying: Negative  Palpable tenderness: Tender to palpation over the left sacroiliac joint. FABER: Mild positive left side. Sensory change: Gross sensation intact to all lumbar and sacral dermatomes.  Reflexes: 2+ at both patellar tendons, 2+ at achilles tendons, Babinski's downgoing.  Strength at foot  Plantar-flexion: 5/5 Dorsi-flexion: 5/5 Eversion: 5/5 Inversion: 5/5  Leg strength  Quad: 5/5 Hamstring: 5/5 Hip flexor: 5/5 Hip abductors: 4+/5 but symmetric Gait unremarkable.  Neck: Inspection unremarkable.  No palpable stepoffs. Negative Spurling's maneuver. The last 5 of rotation to the left Grip strength and sensation normal in bilateral hands Strength good C4 to T1 distribution No sensory change to C4 to T1 Negative Hoffman sign bilaterally Reflexes normal  Osteopathic findings Cervical C2 flexed rotated and side bent right C4 flexed rotated and side bent left C7 flexed rotated and side bent  left T3 extended rotated and side bent right inhaled third rib T9 extended rotated and side bent left L3 flexed rotated and side bent right Sacrum right on right     Impression and Recommendations:     This case required medical decision making of moderate complexity.      Note: This dictation was prepared with Dragon dictation along with smaller phrase technology. Any transcriptional errors that result from this process are unintentional.

## 2017-04-25 NOTE — Progress Notes (Signed)
[name] [address] [place], [place] [address] Phone: [phone] Subjective:     CC: Headache and back pain  [id number]  Dorothy [name] is a 62 y.o. female coming in with complaint of headache and neck pain.  Patient has done relatively well.  Has been fairly 4 months since we have seen patient.  Over the course last several week started having increasing discomfort and pain again.  Describes it as a dull, throbbing aching pain.  Nothing severe, no radiation to any of the extremities     No past medical history on file. Past Surgical History:  Procedure Laterality Date  . CESAREAN SECTION  1994   Social History   Socioeconomic History  . Marital status: Married    Spouse name: None  . Number of children: None  . Years of education: None  . Highest education level: None  Social Needs  . Financial resource strain: None  . Food insecurity - worry: None  . Food insecurity - inability: None  . Transportation needs - medical: None  . Transportation needs - non-medical: None  Occupational History  . None  Tobacco Use  . Smoking status: Never Smoker  . Smokeless tobacco: Never Used  Substance and Sexual Activity  . Alcohol use: Yes  . Drug use: No  . Sexual activity: Yes    Birth control/protection: Post-menopausal  Other Topics Concern  . None  Social History Narrative  . None   No Known Allergies Family History  Problem Relation Age of Onset  . Hypertension Mother   . Cancer Father        BRAIN TUMOR     Past medical history, social, surgical and family history all reviewed in electronic medical record.  No pertanent information unless stated regarding to the chief complaint.   Review of Systems:Review of systems updated and as accurate as of 04/26/17  No visual changes, nausea, vomiting, diarrhea, constipation, dizziness, abdominal pain, skin rash, fevers, chills, night sweats, weight loss, swollen lymph nodes, body  aches, joint swelling, chest pain, shortness of breath, mood changes.  Positive muscle aches, headaches  Objective  Blood pressure 122/80, pulse 63, height 5' 1.5" (1.562 m), weight 126 lb (57.2 kg), last menstrual period 04/16/2008, SpO2 97 %. Systems examined below as of 04/26/17   General: No apparent distress alert and oriented x3 mood and affect normal, dressed appropriately.  HEENT: Pupils equal, extraocular movements intact  Respiratory: Patient's speak in full sentences and does not appear short of breath  Cardiovascular: No lower extremity edema, non tender, no erythema  Skin: Warm dry intact with no signs of infection or rash on extremities or on axial skeleton.  Abdomen: Soft nontender  Neuro: Cranial nerves II through XII are intact, neurovascularly intact in all extremities with 2+ DTRs and 2+ pulses.  Lymph: No lymphadenopathy of posterior or anterior cervical chain or axillae bilaterally.  Gait normal with good balance and coordination.  MSK:  Non tender with full range of motion and good stability and symmetric strength and tone of shoulders, elbows, wrist, hip, knee and ankles bilaterally.  Neck: Inspection mild loss of lordosis. No palpable stepoffs. Negative Spurling's maneuver. Mild limitation in side bending bilaterally. Grip strength and sensation normal in bilateral hands Strength good C4 to T1 distribution No sensory change to C4 to T1 Negative Hoffman sign bilaterally Reflexes normal  Back Exam:  Inspection: Unremarkable  Motion: Flexion 45 deg, Extension 25 deg, Side Bending to 45 deg  bilaterally,  Rotation to 45 deg bilaterally mild crepitus noted with range of motion especially in rotation SLR laying: Negative  XSLR laying: Negative  Palpable tenderness: Mild tenderness of the right sacroiliac joint. FABER: Positive on the right. Sensory change: Gross sensation intact to all lumbar and sacral dermatomes.  Reflexes: 2+ at both patellar tendons, 2+ at  achilles tendons, Babinski's downgoing.  Strength at foot  Plantar-flexion: 5/5 Dorsi-flexion: 5/5 Eversion: 5/5 Inversion: 5/5  Leg strength  Quad: 5/5 Hamstring: 5/5 Hip flexor: 5/5 Hip abductors: 5/5  Gait unremarkable.   Osteopathic findings C6 flexed rotated and side bent left T3 extended rotated and side bent right inhaled third rib T6 extended rotated and side bent left L2 flexed rotated and side bent right Sacrum right on right    Impression and Recommendations:     This case required medical decision making of moderate complexity.      Note: This dictation was prepared with Dragon dictation along with smaller phrase technology. Any transcriptional errors that result from this process are unintentional.

## 2017-04-26 ENCOUNTER — Encounter: Payer: Self-pay | Admitting: Family Medicine

## 2017-04-26 ENCOUNTER — Ambulatory Visit: Payer: BLUE CROSS/BLUE SHIELD | Admitting: Family Medicine

## 2017-04-26 VITALS — BP 122/80 | HR 63 | Ht 61.5 in | Wt 126.0 lb

## 2017-04-26 DIAGNOSIS — M999 Biomechanical lesion, unspecified: Secondary | ICD-10-CM | POA: Diagnosis not present

## 2017-04-26 DIAGNOSIS — M533 Sacrococcygeal disorders, not elsewhere classified: Secondary | ICD-10-CM | POA: Diagnosis not present

## 2017-04-26 NOTE — Assessment & Plan Note (Signed)
Patient does still have the sacroiliac dysfunction.  Likely has some arthritic changes.  Some increase in crepitus noted.  Encourage patient to take the vitamin D on a more regular basis.  Has topical anti-inflammatories if needed.  Follow-up with me again in 3-4 months.

## 2017-04-26 NOTE — Assessment & Plan Note (Signed)
Decision today to treat with OMT was based on Physical Exam  After verbal consent patient was treated with HVLA, ME, FPR techniques in cervical, thoracic, rib, lumbar and sacral areas  Patient tolerated the procedure well with improvement in symptoms  Patient given exercises, stretches and lifestyle modifications  See medications in patient instructions if given  Patient will follow up in 12 weeks 

## 2017-05-23 DIAGNOSIS — Z1231 Encounter for screening mammogram for malignant neoplasm of breast: Secondary | ICD-10-CM | POA: Diagnosis not present

## 2017-08-29 ENCOUNTER — Other Ambulatory Visit: Payer: Self-pay | Admitting: Family Medicine

## 2017-08-29 ENCOUNTER — Other Ambulatory Visit (HOSPITAL_COMMUNITY)
Admission: RE | Admit: 2017-08-29 | Discharge: 2017-08-29 | Disposition: A | Discharge status: Home / Self Care | Payer: BLUE CROSS/BLUE SHIELD | Source: Ambulatory Visit | Attending: Family Medicine | Admitting: Family Medicine

## 2017-08-29 DIAGNOSIS — Z0001 Encounter for general adult medical examination with abnormal findings: Secondary | ICD-10-CM | POA: Diagnosis not present

## 2017-08-29 DIAGNOSIS — Z124 Encounter for screening for malignant neoplasm of cervix: Secondary | ICD-10-CM | POA: Insufficient documentation

## 2017-08-29 DIAGNOSIS — M79671 Pain in right foot: Secondary | ICD-10-CM | POA: Diagnosis not present

## 2017-08-29 DIAGNOSIS — M8588 Other specified disorders of bone density and structure, other site: Secondary | ICD-10-CM | POA: Diagnosis not present

## 2017-08-30 LAB — CYTOLOGY - PAP: Diagnosis: NEGATIVE

## 2017-09-08 NOTE — Progress Notes (Signed)
[name] [address] [place], [place] [address] Phone: [phone] Subjective:     CC: Right heel pain  [id number]:[id number]  Dorothy [name] is a 62 y.o. female coming in with complaint of right heel pain. She has been having an increase in pain for 1 month. She wears a brace when she works out. She has pain in her achilles as well. She has been using voltaren gel and rolling with a golf ball. She also uses a heel cushion.     No past medical history on file. Past Surgical History:  Procedure Laterality Date  . CESAREAN SECTION  1994   Social History   Socioeconomic History  . Marital status: Married    Spouse name: Not on file  . Number of children: Not on file  . Years of education: Not on file  . Highest education level: Not on file  Occupational History  . Not on file  Social Needs  . Financial resource strain: Not on file  . Food insecurity:    Worry: Not on file    Inability: Not on file  . Transportation needs:    Medical: Not on file    Non-medical: Not on file  Tobacco Use  . Smoking status: Never Smoker  . Smokeless tobacco: Never Used  Substance and Sexual Activity  . Alcohol use: Yes  . Drug use: No  . Sexual activity: Yes    Birth control/protection: Post-menopausal  Lifestyle  . Physical activity:    Days per week: Not on file    Minutes per session: Not on file  . Stress: Not on file  Relationships  . Social connections:    Talks on phone: Not on file    Gets together: Not on file    Attends religious service: Not on file    Active member of club or organization: Not on file    Attends meetings of clubs or organizations: Not on file    Relationship status: Not on file  Other Topics Concern  . Not on file  Social History Narrative  . Not on file   No Known Allergies Family History  Problem Relation Age of Onset  . Hypertension Mother   . Cancer Father        BRAIN TUMOR     Past medical history,  social, surgical and family history all reviewed in electronic medical record.  No pertanent information unless stated regarding to the chief complaint.   Review of Systems:Review of systems updated and as accurate as of 09/08/17  No headache, visual changes, nausea, vomiting, diarrhea, constipation, dizziness, abdominal pain, skin rash, fevers, chills, night sweats, weight loss, swollen lymph nodes, body aches, joint swelling, muscle aches, chest pain, shortness of breath, mood changes.   Objective  Last menstrual period 04/16/2008. Systems examined below as of 09/08/17   General: No apparent distress alert and oriented x3 mood and affect normal, dressed appropriately.  HEENT: Pupils equal, extraocular movements intact  Respiratory: Patient's speak in full sentences and does not appear short of breath  Cardiovascular: No lower extremity edema, non tender, no erythema  Skin: Warm dry intact with no signs of infection or rash on extremities or on axial skeleton.  Abdomen: Soft nontender  Neuro: Cranial nerves II through XII are intact, neurovascularly intact in all extremities with 2+ DTRs and 2+ pulses.  Lymph: No lymphadenopathy of posterior or anterior cervical chain or axillae bilaterally.  Gait normal with good balance and coordination.  MSK:  Non tender with full range of motion and good stability and symmetric strength and tone of shoulders, elbows, wrist, hip, knee bilaterally.  Ankle: Right No visible erythema or swelling. Range of motion is full in all directions. Strength is 5/5 in all directions. Stable lateral and medial ligaments; squeeze test and kleiger test unremarkable; Talar dome nontender; No pain at base of 5th MT; No tenderness over cuboid; No tenderness over N spot or navicular prominence No tenderness on posterior aspects of lateral and medial malleolus Tender over the Achilles tendon. No sign of peroneal tendon subluxations or tenderness to palpation Negative  tarsal tunnel tinel's Able to walk 4 steps. Contralateral ankle unremarkable  MSK US performed of: Right ankle   this study was ordered, performed, and interpreted by Charlann Boxer D.O.  Foot/Ankle:   All structures visualized.   Talar dome unremarkable  Ankle mortise without effusion. Peroneus longus and brevis tendons unremarkable on long and transverse views without sheath effusions. Posterior tibialis, flexor hallucis longus, and flexor digitorum longus tendons unremarkable on long and transverse views without sheath effusions. Achilles tendon visualized patient does have some thickening noted that seems to be chronic.  No increasing Doppler flow or hypoechoic changes. Anterior Talofibular Ligament and Calcaneofibular Ligaments unremarkable and intact. Deltoid Ligament unremarkable and intact. Plantar fascia intact and without effusion, normal thickness. No increased doppler signal, cap sign, or thickening of tibial cortex. Power doppler signal normal. Impression: Achilles tendinosis    Impression and Recommendations:     This case required medical decision making of moderate complexity.      Note: This dictation was prepared with Dragon dictation along with smaller phrase technology. Any transcriptional errors that result from this process are unintentional.

## 2017-09-10 ENCOUNTER — Encounter: Payer: Self-pay | Admitting: Family Medicine

## 2017-09-10 ENCOUNTER — Ambulatory Visit: Payer: Self-pay

## 2017-09-10 ENCOUNTER — Ambulatory Visit: Payer: BLUE CROSS/BLUE SHIELD | Admitting: Family Medicine

## 2017-09-10 VITALS — BP 120/72 | HR 64 | Ht 61.5 in | Wt 125.0 lb

## 2017-09-10 DIAGNOSIS — G8929 Other chronic pain: Secondary | ICD-10-CM | POA: Diagnosis not present

## 2017-09-10 DIAGNOSIS — H11152 Pinguecula, left eye: Secondary | ICD-10-CM | POA: Diagnosis not present

## 2017-09-10 DIAGNOSIS — M6788 Other specified disorders of synovium and tendon, other site: Secondary | ICD-10-CM | POA: Insufficient documentation

## 2017-09-10 DIAGNOSIS — H01111 Allergic dermatitis of right upper eyelid: Secondary | ICD-10-CM | POA: Diagnosis not present

## 2017-09-10 DIAGNOSIS — M7661 Achilles tendinitis, right leg: Secondary | ICD-10-CM

## 2017-09-10 DIAGNOSIS — H01114 Allergic dermatitis of left upper eyelid: Secondary | ICD-10-CM | POA: Diagnosis not present

## 2017-09-10 DIAGNOSIS — M79671 Pain in right foot: Secondary | ICD-10-CM

## 2017-09-10 DIAGNOSIS — H11151 Pinguecula, right eye: Secondary | ICD-10-CM | POA: Diagnosis not present

## 2017-09-10 MED ORDER — NITROGLYCERIN 0.2 MG/HR TD PT24: MEDICATED_PATCH | TRANSDERMAL | 1 refills | Status: DC

## 2017-09-10 NOTE — Assessment & Plan Note (Signed)
Achilles tendinosis of the right ankle show the patient does have some chronic thickening noted.  Tender to palpation in the area.  We discussed heel lift, icing regimen, home exercises and started on nitroglycerin.  Discussed with patient about proper shoes.  Follow-up again in 4 weeks

## 2017-09-10 NOTE — Patient Instructions (Signed)
Good to see you  Exercises 3 times a week.   Nitroglycerin Protocol   Apply 1/4 nitroglycerin patch to affected area daily.  Change position of patch within the affected area every 24 hours.  You may experience a headache during the first 1-2 weeks of using the patch, these should subside.  If you experience headaches after beginning nitroglycerin patch treatment, you may take your preferred over the counter pain reliever.  Another side effect of the nitroglycerin patch is skin irritation or rash related to patch adhesive.  Please notify our office if you develop more severe headaches or rash, and stop the patch.  Tendon healing with nitroglycerin patch may require 12 to 24 weeks depending on the extent of injury.  Men should not use if taking Viagra, Cialis, or Levitra.   Do not use if you have migraines or rosacea.   Don't beat up on the others too bad at tennis  See me again in 3-4 weeks

## 2017-09-19 DIAGNOSIS — D2372 Other benign neoplasm of skin of left lower limb, including hip: Secondary | ICD-10-CM | POA: Diagnosis not present

## 2017-09-19 DIAGNOSIS — L821 Other seborrheic keratosis: Secondary | ICD-10-CM | POA: Diagnosis not present

## 2017-09-19 DIAGNOSIS — L570 Actinic keratosis: Secondary | ICD-10-CM | POA: Diagnosis not present

## 2017-10-04 DIAGNOSIS — H524 Presbyopia: Secondary | ICD-10-CM | POA: Diagnosis not present

## 2018-02-25 NOTE — Progress Notes (Signed)
[name] [place], [place] [address] Phone: [phone] Subjective:   [name], am serving as a scribe for Dr. Hulan Saas.   CC: Back and neck pain  CZY:[id number]  Dorothy [name] is a 62 y.o. female coming in with complaint of neck and thoracic spine pain. She said that she has not been in for a while and she is feeling tight. No new pain since last visit.  Patient does stay very active.  Notices neck pain with tenderness and then notices hip pain with cough.     No past medical history on file. Past Surgical History:  Procedure Laterality Date  . CESAREAN SECTION  1994   Social History   Socioeconomic History  . Marital status: Married    Spouse name: Not on file  . Number of children: Not on file  . Years of education: Not on file  . Highest education level: Not on file  Occupational History  . Not on file  Social Needs  . Financial resource strain: Not on file  . Food insecurity:    Worry: Not on file    Inability: Not on file  . Transportation needs:    Medical: Not on file    Non-medical: Not on file  Tobacco Use  . Smoking status: Never Smoker  . Smokeless tobacco: Never Used  Substance and Sexual Activity  . Alcohol use: Yes  . Drug use: No  . Sexual activity: Yes    Birth control/protection: Post-menopausal  Lifestyle  . Physical activity:    Days per week: Not on file    Minutes per session: Not on file  . Stress: Not on file  Relationships  . Social connections:    Talks on phone: Not on file    Gets together: Not on file    Attends religious service: Not on file    Active member of club or organization: Not on file    Attends meetings of clubs or organizations: Not on file    Relationship status: Not on file  Other Topics Concern  . Not on file  Social History Narrative  . Not on file   No Known Allergies Family History  Problem Relation Age of Onset  . Hypertension Mother   .  Cancer Father        BRAIN TUMOR     Current Outpatient Medications (Cardiovascular):  .  nitroGLYCERIN (NITRODUR - DOSED IN MG/24 HR) 0.2 mg/hr patch, 1/4 patch daily     Current Outpatient Medications (Other):  .  calcium-vitamin D (OSCAL) 250-125 MG-UNIT per tablet, Take 1 tablet by mouth daily. .  Omega-3 Fatty Acids (FISH OIL PO), Take by mouth. Marland Kitchen  PENNSAID 2 % SOLN, APPLY 2 PUMPS TO AFFECTED AREA TOPICALLY TWICE A DAY    Past medical history, social, surgical and family history all reviewed in electronic medical record.  No pertanent information unless stated regarding to the chief complaint.   Review of Systems:  No headache, visual changes, nausea, vomiting, diarrhea, constipation, dizziness, abdominal pain, skin rash, fevers, chills, night sweats, weight loss, swollen lymph nodes, body aches, joint swelling,  chest pain, shortness of breath, mood changes.  Positive muscle aches  Objective  Blood pressure 122/80, pulse (!) 59, height 5' 1.5" (1.562 m), weight 117 lb (53.1 kg), last menstrual period 04/16/2008, SpO2 99 %.    General: No apparent distress alert and oriented x3 mood and affect normal, dressed appropriately.  HEENT:  Pupils equal, extraocular movements intact  Respiratory: Patient's speak in full sentences and does not appear short of breath  Cardiovascular: No lower extremity edema, non tender, no erythema  Skin: Warm dry intact with no signs of infection or rash on extremities or on axial skeleton.  Abdomen: Soft nontender  Neuro: Cranial nerves II through XII are intact, neurovascularly intact in all extremities with 2+ DTRs and 2+ pulses.  Lymph: No lymphadenopathy of posterior or anterior cervical chain or axillae bilaterally.  Gait normal with good balance and coordination.  MSK:  Non tender with full range of motion and good stability and symmetric strength and tone of shoulders, elbows, wrist, hip, knee and ankles bilaterally.  Neck: Inspection mild  loss of lordosis. No palpable stepoffs. Negative Spurling's maneuver. Full neck range of motion Grip strength and sensation normal in bilateral hands Strength good C4 to T1 distribution No sensory change to C4 to T1 Negative Hoffman sign bilaterally Reflexes normal Severe tightness in the left trapezius  Osteopathic findings C2 flexed rotated and side bent right C7 flexed rotated and side bent left T3 extended rotated and side bent right inhaled third rib T5 extended rotated and side bent left L2 flexed rotated and side bent right Sacrum right on right     Impression and Recommendations:     This case required medical decision making of moderate complexity. The above documentation has been reviewed and is accurate and complete [name], DO       Note: This dictation was prepared with Dragon dictation along with smaller phrase technology. Any transcriptional errors that result from this process are unintentional.

## 2018-02-26 ENCOUNTER — Ambulatory Visit: Payer: BLUE CROSS/BLUE SHIELD | Admitting: Family Medicine

## 2018-02-26 ENCOUNTER — Encounter: Payer: Self-pay | Admitting: Family Medicine

## 2018-02-26 VITALS — BP 122/80 | HR 59 | Ht 61.5 in | Wt 117.0 lb

## 2018-02-26 DIAGNOSIS — M545 Low back pain, unspecified: Secondary | ICD-10-CM

## 2018-02-26 DIAGNOSIS — G8929 Other chronic pain: Secondary | ICD-10-CM | POA: Diagnosis not present

## 2018-02-26 DIAGNOSIS — M9981 Other biomechanical lesions of cervical region: Secondary | ICD-10-CM | POA: Diagnosis not present

## 2018-02-26 DIAGNOSIS — M999 Biomechanical lesion, unspecified: Secondary | ICD-10-CM | POA: Insufficient documentation

## 2018-02-26 NOTE — Patient Instructions (Signed)
Good to see you  Ice is your friend Have a great trip  See me again in 2-3 months!

## 2018-02-26 NOTE — Assessment & Plan Note (Signed)
Patient has been doing relatively well though.  Responded well to manipulation.  Discussed icing regimen and home exercises.  Discussed posture and ergonomics.  Follow-up again in 4 to 6 weeks

## 2018-02-26 NOTE — Assessment & Plan Note (Signed)
Decision today to treat with OMT was based on Physical Exam  After verbal consent patient was treated with HVLA, ME, FPR techniques in cervical, thoracic, rib, lumbar and sacral areas  Patient tolerated the procedure well with improvement in symptoms  Patient given exercises, stretches and lifestyle modifications  See medications in patient instructions if given  Patient will follow up in 12 weeks

## 2018-04-28 ENCOUNTER — Encounter

## 2018-04-28 NOTE — Progress Notes (Signed)
[name] [place] [place], [place] [address] Phone: [phone] Subjective:     CC: Headaches  [id number]  Dorothy [name] is a 63 y.o. female coming in with complaint of neck pain. Patient states that she has had an increase in headaches since last visit. Did have a massage on Friday which helped. Does feel c-spine tightness. Mild low back pain but nothing as severe.  Has responded well to manipulation for both previously.  Wants to avoid any type of chronic medications if possible.  Continues to play golf on a fairly regular basis.  Headaches denies any visual changes, any nausea or vomiting, any association with numbness of the fingertips.    No past medical history on file. Past Surgical History:  Procedure Laterality Date  . CESAREAN SECTION  1994   Social History   Socioeconomic History  . Marital status: Married    Spouse name: Not on file  . Number of children: Not on file  . Years of education: Not on file  . Highest education level: Not on file  Occupational History  . Not on file  Social Needs  . Financial resource strain: Not on file  . Food insecurity:    Worry: Not on file    Inability: Not on file  . Transportation needs:    Medical: Not on file    Non-medical: Not on file  Tobacco Use  . Smoking status: Never Smoker  . Smokeless tobacco: Never Used  Substance and Sexual Activity  . Alcohol use: Yes  . Drug use: No  . Sexual activity: Yes    Birth control/protection: Post-menopausal  Lifestyle  . Physical activity:    Days per week: Not on file    Minutes per session: Not on file  . Stress: Not on file  Relationships  . Social connections:    Talks on phone: Not on file    Gets together: Not on file    Attends religious service: Not on file    Active member of club or organization: Not on file    Attends meetings of clubs or organizations: Not on file    Relationship status: Not on file  Other Topics  Concern  . Not on file  Social History Narrative  . Not on file   No Known Allergies Family History  Problem Relation Age of Onset  . Hypertension Mother   . Cancer Father        BRAIN TUMOR     Current Outpatient Medications (Cardiovascular):  .  nitroGLYCERIN (NITRODUR - DOSED IN MG/24 HR) 0.2 mg/hr patch, 1/4 patch daily     Current Outpatient Medications (Other):  .  calcium-vitamin D (OSCAL) 250-125 MG-UNIT per tablet, Take 1 tablet by mouth daily. .  Omega-3 Fatty Acids (FISH OIL PO), Take by mouth. Marland Kitchen  PENNSAID 2 % SOLN, APPLY 2 PUMPS TO AFFECTED AREA TOPICALLY TWICE A DAY    Past medical history, social, surgical and family history all reviewed in electronic medical record.  No pertanent information unless stated regarding to the chief complaint.   Review of Systems:  No , visual changes, nausea, vomiting, diarrhea, constipation, dizziness, abdominal pain, skin rash, fevers, chills, night sweats, weight loss, swollen lymph nodes, body aches, joint swelling, , chest pain, shortness of breath, mood changes.  Positive headaches and muscle aches  Objective  Blood pressure 102/72, pulse 73, height 5' 1.5" (1.562 m), weight 117 lb (53.1 kg), last menstrual period 04/16/2008, SpO2  99 %.    General: No apparent distress alert and oriented x3 mood and affect normal, dressed appropriately.  HEENT: Pupils equal, extraocular movements intact  Respiratory: Patient's speak in full sentences and does not appear short of breath  Cardiovascular: No lower extremity edema, non tender, no erythema  Skin: Warm dry intact with no signs of infection or rash on extremities or on axial skeleton.  Abdomen: Soft nontender  Neuro: Cranial nerves II through XII are intact, neurovascularly intact in all extremities with 2+ DTRs and 2+ pulses.  Lymph: No lymphadenopathy of posterior or anterior cervical chain or axillae bilaterally.  Gait normal with good balance and coordination.  MSK:  Non  tender with full range of motion and good stability and symmetric strength and tone of shoulders, elbows, wrist, hip, knee and ankles bilaterally.  Neck: Inspection unremarkable. No palpable stepoffs. Negative Spurling's maneuver. Full neck range of motion Grip strength and sensation normal in bilateral hands Strength good C4 to T1 distribution No sensory change to C4 to T1 Negative Hoffman sign bilaterally Reflexes normal  Back Exam:  Inspection: Mild loss of lordosis Motion: Flexion 35 deg, Extension 25 deg, Side Bending to 35 deg bilaterally,  Rotation to 35 deg bilaterally  SLR laying: Negative  XSLR laying: Negative  Palpable tenderness: mild TTP overall . FABER: tightness overall mild increase on the right side Sensory change: Gross sensation intact to all lumbar and sacral dermatomes.  Reflexes: 2+ at both patellar tendons, 2+ at achilles tendons, Babinski's downgoing.  Strength at foot  Plantar-flexion: 5/5 Dorsi-flexion: 5/5 Eversion: 5/5 Inversion: 5/5  Leg strength  Quad: 5/5 Hamstring: 5/5 Hip flexor: 5/5 Hip abductors: 5/5  Gait unremarkable.  Osteopathic findings C2 flexed rotated and side bent right C6 flexed rotated and side bent left T3 extended rotated and side bent right inhaled third rib T5 extended rotated and side bent left L2 flexed rotated and side bent right Sacrum right on right    Impression and Recommendations:     This case required medical decision making of moderate complexity. The above documentation has been reviewed and is accurate and complete [name], DO       Note: This dictation was prepared with Dragon dictation along with smaller phrase technology. Any transcriptional errors that result from this process are unintentional.

## 2018-04-29 ENCOUNTER — Ambulatory Visit: Payer: BLUE CROSS/BLUE SHIELD | Admitting: Family Medicine

## 2018-04-29 ENCOUNTER — Encounter: Payer: Self-pay | Admitting: Family Medicine

## 2018-04-29 VITALS — BP 102/72 | HR 73 | Ht 61.5 in | Wt 117.0 lb

## 2018-04-29 DIAGNOSIS — M999 Biomechanical lesion, unspecified: Secondary | ICD-10-CM

## 2018-04-29 DIAGNOSIS — G4486 Cervicogenic headache: Secondary | ICD-10-CM

## 2018-04-29 DIAGNOSIS — M533 Sacrococcygeal disorders, not elsewhere classified: Secondary | ICD-10-CM

## 2018-04-29 DIAGNOSIS — R51 Headache: Secondary | ICD-10-CM | POA: Diagnosis not present

## 2018-04-29 NOTE — Assessment & Plan Note (Signed)
Decision today to treat with OMT was based on Physical Exam  After verbal consent patient was treated with HVLA, ME, FPR techniques in cervical, thoracic, rib lumbar and sacral areas  Patient tolerated the procedure well with improvement in symptoms  Patient given exercises, stretches and lifestyle modifications  See medications in patient instructions if given  Patient will follow up in 4-8 weeks 

## 2018-04-29 NOTE — Patient Instructions (Signed)
Good to see you  Ice is your friend Stay active CoQ10 200mg  daily  Lets watch the headaches Travel safe  See me again in 6-8 weeks

## 2018-04-29 NOTE — Assessment & Plan Note (Signed)
Discussed posture and ergonomics.  Discussed which activities to do which was to avoid.  Follow-up again 4 to 8 weeks.

## 2018-04-29 NOTE — Assessment & Plan Note (Signed)
Mild worsening symptoms today.  Discussed icing regimen and home exercise.  Discussed which activities doing which wants to avoid we discussed proper positioning.  Follow-up again in 4 to 8 weeks

## 2018-05-29 DIAGNOSIS — Z1231 Encounter for screening mammogram for malignant neoplasm of breast: Secondary | ICD-10-CM | POA: Diagnosis not present

## 2018-09-19 ENCOUNTER — Other Ambulatory Visit: Payer: Self-pay | Admitting: Family Medicine

## 2018-09-19 ENCOUNTER — Other Ambulatory Visit (HOSPITAL_COMMUNITY)
Admission: RE | Admit: 2018-09-19 | Discharge: 2018-09-19 | Disposition: A | Discharge status: Home / Self Care | Payer: BC Managed Care – PPO | Source: Ambulatory Visit | Attending: Family Medicine | Admitting: Family Medicine

## 2018-09-19 DIAGNOSIS — Z124 Encounter for screening for malignant neoplasm of cervix: Secondary | ICD-10-CM | POA: Diagnosis not present

## 2018-09-19 DIAGNOSIS — M8588 Other specified disorders of bone density and structure, other site: Secondary | ICD-10-CM | POA: Diagnosis not present

## 2018-09-19 DIAGNOSIS — Z0000 Encounter for general adult medical examination without abnormal findings: Secondary | ICD-10-CM | POA: Diagnosis not present

## 2018-09-23 ENCOUNTER — Encounter: Payer: Self-pay | Admitting: Family Medicine

## 2018-09-23 ENCOUNTER — Ambulatory Visit (INDEPENDENT_AMBULATORY_CARE_PROVIDER_SITE_OTHER)
Admission: RE | Admit: 2018-09-23 | Discharge: 2018-09-23 | Disposition: A | Discharge status: Home / Self Care | Payer: BC Managed Care – PPO | Source: Ambulatory Visit | Attending: Family Medicine | Admitting: Family Medicine

## 2018-09-23 ENCOUNTER — Ambulatory Visit: Payer: BLUE CROSS/BLUE SHIELD | Admitting: Family Medicine

## 2018-09-23 ENCOUNTER — Other Ambulatory Visit: Payer: Self-pay

## 2018-09-23 VITALS — BP 116/80 | HR 65 | Ht 61.0 in | Wt 114.0 lb

## 2018-09-23 DIAGNOSIS — M542 Cervicalgia: Secondary | ICD-10-CM

## 2018-09-23 DIAGNOSIS — G8929 Other chronic pain: Secondary | ICD-10-CM

## 2018-09-23 DIAGNOSIS — M533 Sacrococcygeal disorders, not elsewhere classified: Secondary | ICD-10-CM | POA: Diagnosis not present

## 2018-09-23 DIAGNOSIS — M999 Biomechanical lesion, unspecified: Secondary | ICD-10-CM

## 2018-09-23 LAB — CYTOLOGY - PAP: Diagnosis: NEGATIVE

## 2018-09-23 NOTE — Assessment & Plan Note (Signed)
Decision today to treat with OMT was based on Physical Exam  After verbal consent patient was treated with HVLA, ME, FPR techniques in cervical, thoracic, rib lumbar and sacral areas  Patient tolerated the procedure well with improvement in symptoms  Patient given exercises, stretches and lifestyle modifications  See medications in patient instructions if given  Patient will follow up in 4-8 weeks 

## 2018-09-23 NOTE — Assessment & Plan Note (Signed)
Continues to patient's normal.  Posture normal.  Discussed which activities of doing which wants to avoid.  Topical anti-inflammatories.  Discussed hip abductors.  Discussed ergonomics throughout the day and eating on a more regular basis.  Follow-up again in 6 weeks

## 2018-09-23 NOTE — Patient Instructions (Signed)
Good to see you.  pennsaid pinkie amount topically 2 times daily as needed.  Keep monitor at eye level See me again in 6 weeks

## 2018-09-23 NOTE — Progress Notes (Signed)
[name] [place], [place] [address] Phone: [phone] Subjective:   I Dorothy [name] am serving as a Education administrator for Dr. Hulan [name].   CC: Low back pain  [id number]:[id number]  Dorothy [name] is a 63 y.o. female coming in with complaint of SI joint pain. States that she is feeling ok. Believes she is getting arthritis in her hands. Doctor suggested tart cherry.  Mild increasing tightness.  Has not been working out quite as regularly.  When she does she knows that does seem to feel better.  Patient denies any significant radiation of the pain but mild radiation.     No past medical history on file. Past Surgical History:  Procedure Laterality Date  . CESAREAN SECTION  1994   Social History   Socioeconomic History  . Marital status: Married    Spouse name: Not on file  . Number of children: Not on file  . Years of education: Not on file  . Highest education level: Not on file  Occupational History  . Not on file  Social Needs  . Financial resource strain: Not on file  . Food insecurity:    Worry: Not on file    Inability: Not on file  . Transportation needs:    Medical: Not on file    Non-medical: Not on file  Tobacco Use  . Smoking status: Never Smoker  . Smokeless tobacco: Never Used  Substance and Sexual Activity  . Alcohol use: Yes  . Drug use: No  . Sexual activity: Yes    Birth control/protection: Post-menopausal  Lifestyle  . Physical activity:    Days per week: Not on file    Minutes per session: Not on file  . Stress: Not on file  Relationships  . Social connections:    Talks on phone: Not on file    Gets together: Not on file    Attends religious service: Not on file    Active member of club or organization: Not on file    Attends meetings of clubs or organizations: Not on file    Relationship status: Not on file  Other Topics Concern  . Not on file  Social History Narrative  . Not on file   No Known  Allergies Family History  Problem Relation Age of Onset  . Hypertension Mother   . Cancer Father        BRAIN TUMOR     Current Outpatient Medications (Cardiovascular):  .  nitroGLYCERIN (NITRODUR - DOSED IN MG/24 HR) 0.2 mg/hr patch, 1/4 patch daily     Current Outpatient Medications (Other):  .  calcium-vitamin D (OSCAL) 250-125 MG-UNIT per tablet, Take 1 tablet by mouth daily. Marland Kitchen  PENNSAID 2 % SOLN, APPLY 2 PUMPS TO AFFECTED AREA TOPICALLY TWICE A DAY    Past medical history, social, surgical and family history all reviewed in electronic medical record.  No pertanent information unless stated regarding to the chief complaint.   Review of Systems:  No headache, visual changes, nausea, vomiting, diarrhea, constipation, dizziness, abdominal pain, skin rash, fevers, chills, night sweats, weight loss, swollen lymph nodes, body aches, joint swelling, muscle aches, chest pain, shortness of breath, mood changes.   Objective  Blood pressure 116/80, pulse 65, height 5\' 1"  (1.549 m), weight 114 lb (51.7 kg), last menstrual period 04/16/2008, SpO2 99 %.    General: No apparent distress alert and oriented x3 mood and affect normal, dressed appropriately.  HEENT: Pupils equal, extraocular  movements intact  Respiratory: Patient's speak in full sentences and does not appear short of breath  Cardiovascular: No lower extremity edema, non tender, no erythema  Skin: Warm dry intact with no signs of infection or rash on extremities or on axial skeleton.  Abdomen: Soft nontender  Neuro: Cranial nerves II through XII are intact, neurovascularly intact in all extremities with 2+ DTRs and 2+ pulses.  Lymph: No lymphadenopathy of posterior or anterior cervical chain or axillae bilaterally.  Gait normal with good balance and coordination.  MSK:  Non tender with full range of motion and good stability and symmetric strength and tone of shoulders, elbows, wrist, hip, knee and ankles bilaterally.  Back  Exam:  Inspection: Loss of lordosis Motion: Flexion 40 deg, Extension 25 deg, Side Bending to 35 deg bilaterally,  Rotation to 45 deg bilaterally  SLR laying: Negative  XSLR laying: Negative  Palpable tenderness: Tender to palpation mostly around the right sacroiliac joint.Marland Kitchen FABER: Positive right. Sensory change: Gross sensation intact to all lumbar and sacral dermatomes.  Reflexes: 2+ at both patellar tendons, 2+ at achilles tendons, Babinski's downgoing.  Strength at foot  Plantar-flexion: 5/5 Dorsi-flexion: 5/5 Eversion: 5/5 Inversion: 5/5  Leg strength  Quad: 5/5 Hamstring: 5/5 Hip flexor: 5/5 Hip abductors: 5/5    Osteopathic findings  C4 flexed rotated and side bent left C6 flexed rotated and side bent left T3 extended rotated and side bent right inhaled third rib T7 extended rotated and side bent left L2 flexed rotated and side bent right Sacrum right on right    Impression and Recommendations:     This case required medical decision making of moderate complexity. The above documentation has been reviewed and is accurate and complete Dorothy Pulley, DO       Note: This dictation was prepared with Dragon dictation along with smaller phrase technology. Any transcriptional errors that result from this process are unintentional.

## 2018-10-06 ENCOUNTER — Encounter: Payer: Self-pay | Admitting: *Deleted

## 2018-10-06 ENCOUNTER — Telehealth: Payer: Self-pay | Admitting: Family Medicine

## 2018-10-06 NOTE — Telephone Encounter (Signed)
Appointment Request From: Herby Abraham    With Provider: [name], [facility]]    Preferred Date Range: 10/07/2018 - 10/09/2018    Preferred Times: Any Time    Reason for visit: Office Visit    Comments:  I wanted to see if you might be able to see my son, Dorothy [name] this week. He's off from work and he's having issues with his back. He came up for Father's day and played 18 holes on Friday but then his back was bothering him so he was unable to play Sat/Sun.   Appointment Request  Dorothy [name]  Patient Appointment Schedule Request Pool 14 hours ago (5:57 PM)     Appointment Request From: Herby Abraham  With Provider: [name], [facility]  Preferred Date Range: 10/07/2018 - 10/09/2018  Preferred Times: Any Time  Reason for visit: Office Visit  Comments: I wanted to see if you might be able to see my son, Dorothy [name] this week.  He's off from work and he's having issues with his back.  He came up for Father's day and played 18 holes on Friday but then his back was bothering him so he was unable to play Sat/Sun.

## 2018-10-06 NOTE — Telephone Encounter (Signed)
Responded via mychart

## 2018-10-06 NOTE — Telephone Encounter (Signed)
We may have a 9 tomorrow it looks like I guess we could try

## 2018-10-22 DIAGNOSIS — D2272 Melanocytic nevi of left lower limb, including hip: Secondary | ICD-10-CM | POA: Diagnosis not present

## 2018-10-22 DIAGNOSIS — L821 Other seborrheic keratosis: Secondary | ICD-10-CM | POA: Diagnosis not present

## 2018-10-22 DIAGNOSIS — L814 Other melanin hyperpigmentation: Secondary | ICD-10-CM | POA: Diagnosis not present

## 2018-10-22 DIAGNOSIS — D225 Melanocytic nevi of trunk: Secondary | ICD-10-CM | POA: Diagnosis not present

## 2018-11-06 DIAGNOSIS — H524 Presbyopia: Secondary | ICD-10-CM | POA: Diagnosis not present

## 2019-01-13 DIAGNOSIS — Z23 Encounter for immunization: Secondary | ICD-10-CM | POA: Diagnosis not present

## 2019-03-30 ENCOUNTER — Encounter: Payer: Self-pay | Admitting: *Deleted

## 2019-04-01 ENCOUNTER — Ambulatory Visit (INDEPENDENT_AMBULATORY_CARE_PROVIDER_SITE_OTHER): Payer: BC Managed Care – PPO | Admitting: Family Medicine

## 2019-04-01 ENCOUNTER — Other Ambulatory Visit: Payer: Self-pay

## 2019-04-01 ENCOUNTER — Encounter: Payer: Self-pay | Admitting: Family Medicine

## 2019-04-01 VITALS — BP 134/86 | HR 74 | Ht 61.0 in | Wt 114.0 lb

## 2019-04-01 DIAGNOSIS — M999 Biomechanical lesion, unspecified: Secondary | ICD-10-CM

## 2019-04-01 DIAGNOSIS — R519 Headache, unspecified: Secondary | ICD-10-CM | POA: Diagnosis not present

## 2019-04-01 DIAGNOSIS — G4486 Cervicogenic headache: Secondary | ICD-10-CM

## 2019-04-01 NOTE — Assessment & Plan Note (Signed)
Discussed which activities "avoid.  Discussed icing regimen and home exercise, which activities to do which wants to avoid.  Follow-up again in 4 to 8 weeks

## 2019-04-01 NOTE — Assessment & Plan Note (Signed)
Decision today to treat with OMT was based on Physical Exam  After verbal consent patient was treated with HVLA, ME, FPR techniques in cervical, thoracic, rib,  lumbar and sacral areas  Patient tolerated the procedure well with improvement in symptoms  Patient given exercises, stretches and lifestyle modifications  See medications in patient instructions if given  Patient will follow up in 4-8 weeks 

## 2019-04-01 NOTE — Progress Notes (Signed)
[name] [address], [place] [address] Phone: [phone] Subjective:   I, [name], am serving as a scribe for Dr. Hulan Saas. This visit occurred during the SARS-CoV-2 public health emergency.  Safety protocols were in place, including screening questions prior to the visit, additional usage of staff PPE, and extensive cleaning of exam room while observing appropriate contact time as indicated for disinfecting solutions.  I'm seeing this patient by the request  of:    CC: Neck pain and headache follow-up  [id number]  Dorothy [name] is a 63 y.o. female coming in with complaint of neck/heachaches. States everything is out of alignment. States she is overdue.  Patient is overweight not in any pain.  Patient has responded well to osteopathic manipulation. .  Discussed which activities to do which wants to avoid.  Patient is to increase activity as tolerated.  Mild increase in stress recently.  No radiation down the arm or any numbness or tingling.       No past medical history on file. Past Surgical History:  Procedure Laterality Date  . CESAREAN SECTION  1994   Social History   Socioeconomic History  . Marital status: Married    Spouse name: Not on file  . Number of children: Not on file  . Years of education: Not on file  . Highest education level: Not on file  Occupational History  . Not on file  Tobacco Use  . Smoking status: Never Smoker  . Smokeless tobacco: Never Used  Substance and Sexual Activity  . Alcohol use: Yes  . Drug use: No  . Sexual activity: Yes    Birth control/protection: Post-menopausal  Other Topics Concern  . Not on file  Social History Narrative  . Not on file   Social Determinants of Health   Financial Resource Strain:   . Difficulty of Paying Living Expenses: Not on file  Food Insecurity:   . Worried About Charity fundraiser in the Last Year: Not on file  . Ran Out of Food in the  Last Year: Not on file  Transportation Needs:   . Lack of Transportation (Medical): Not on file  . Lack of Transportation (Non-Medical): Not on file  Physical Activity:   . Days of Exercise per Week: Not on file  . Minutes of Exercise per Session: Not on file  Stress:   . Feeling of Stress : Not on file  Social Connections:   . Frequency of Communication with Friends and Family: Not on file  . Frequency of Social Gatherings with Friends and Family: Not on file  . Attends Religious Services: Not on file  . Active Member of Clubs or Organizations: Not on file  . Attends Archivist Meetings: Not on file  . Marital Status: Not on file   No Known Allergies Family History  Problem Relation Age of Onset  . Hypertension Mother   . Cancer Father        BRAIN TUMOR     Current Outpatient Medications (Cardiovascular):  .  nitroGLYCERIN (NITRODUR - DOSED IN MG/24 HR) 0.2 mg/hr patch, 1/4 patch daily     Current Outpatient Medications (Other):  .  calcium-vitamin D (OSCAL) 250-125 MG-UNIT per tablet, Take 1 tablet by mouth daily. Marland Kitchen  PENNSAID 2 % SOLN, APPLY 2 PUMPS TO AFFECTED AREA TOPICALLY TWICE A DAY    Past medical history, social, surgical and family history all reviewed in electronic medical record.  No pertanent information unless stated regarding to the chief complaint.   Review of Systems:  No headache, visual changes, nausea, vomiting, diarrhea, constipation, dizziness, abdominal pain, skin rash, fevers, chills, night sweats, weight loss, swollen lymph nodes, body aches, joint swelling, muscle aches, chest pain, shortness of breath, mood changes.   Objective  Last menstrual period 04/16/2008. Systems examined below as of    General: No apparent distress alert and oriented x3 mood and affect normal, dressed appropriately.  HEENT: Pupils equal, extraocular movements intact  Respiratory: Patient's speak in full sentences and does not appear short of breath    Cardiovascular: No lower extremity edema, non tender, no erythema  Skin: Warm dry intact with no signs of infection or rash on extremities or on axial skeleton.  Abdomen: Soft nontender  Neuro: Cranial nerves II through XII are intact, neurovascularly intact in all extremities with 2+ DTRs and 2+ pulses.  Lymph: No lymphadenopathy of posterior or anterior cervical chain or axillae bilaterally.  Gait normal with good balance and coordination.  MSK:  Non tender with full range of motion and good stability and symmetric strength and tone of shoulders, elbows, wrist, hip, knee and ankles bilaterally.   Neck exam shows the patient does have some mild tightness noted.  Some tender to palpation in the paraspinal musculature in the parascapular region right greater than left.  Patient does have full range of motion in the shoulders.  Osteopathic findings C4 flexed rotated and side bent left C6 flexed rotated and side bent left T3 extended rotated and side bent right inhaled third rib T5 extended rotated and side bent left L4 flexed rotated and side bent left  Sacrum left on left     Impression and Recommendations:     This case required medical decision making of moderate complexity. The above documentation has been reviewed and is accurate and complete [name], DO       Note: This dictation was prepared with Dragon dictation along with smaller phrase technology. Any transcriptional errors that result from this process are unintentional.

## 2019-05-05 ENCOUNTER — Encounter: Payer: Self-pay | Admitting: Family Medicine

## 2019-05-05 ENCOUNTER — Other Ambulatory Visit: Payer: Self-pay

## 2019-05-05 ENCOUNTER — Ambulatory Visit: Payer: BC Managed Care – PPO | Admitting: Family Medicine

## 2019-05-05 VITALS — BP 124/80 | HR 68 | Ht 61.0 in | Wt 120.0 lb

## 2019-05-05 DIAGNOSIS — R519 Headache, unspecified: Secondary | ICD-10-CM | POA: Diagnosis not present

## 2019-05-05 DIAGNOSIS — M999 Biomechanical lesion, unspecified: Secondary | ICD-10-CM

## 2019-05-05 DIAGNOSIS — G4486 Cervicogenic headache: Secondary | ICD-10-CM

## 2019-05-05 NOTE — Progress Notes (Signed)
[place] Phone: [phone] Subjective:   [name], am serving as a scribe for Dr. Hulan Saas. This visit occurred during the SARS-CoV-2 public health emergency.  Safety protocols were in place, including screening questions prior to the visit, additional usage of staff PPE, and extensive cleaning of exam room while observing appropriate contact time as indicated for disinfecting solutions.   I'm seeing this patient by the request  of:  Chesley Noon, MD  CC: Headache and neck pain follow-up  [id number]  RHIANNAH [name] is a 64 y.o. female coming in with complaint of neck pain and cervicogenic headaches. Patient last seen December 16th, 2020 for OMT. Patient states that her headaches are less frequent due to manipulation. Does feel tightness and is glad that she was able to come in sooner for her appointment.  Patient is feeling like she is doing better with the routine as well.  Working on Personal assistant at work which she thinks is also helpful.      No past medical history on file. Past Surgical History:  Procedure Laterality Date  . CESAREAN SECTION  1994   Social History   Socioeconomic History  . Marital status: Married    Spouse name: Not on file  . Number of children: Not on file  . Years of education: Not on file  . Highest education level: Not on file  Occupational History  . Not on file  Tobacco Use  . Smoking status: Never Smoker  . Smokeless tobacco: Never Used  Substance and Sexual Activity  . Alcohol use: Yes  . Drug use: No  . Sexual activity: Yes    Birth control/protection: Post-menopausal  Other Topics Concern  . Not on file  Social History Narrative  . Not on file   Social Determinants of Health   Financial Resource Strain:   . Difficulty of Paying Living Expenses: Not on file  Food Insecurity:   . Worried About Charity fundraiser in the Last Year: Not on  file  . Ran Out of Food in the Last Year: Not on file  Transportation Needs:   . Lack of Transportation (Medical): Not on file  . Lack of Transportation (Non-Medical): Not on file  Physical Activity:   . Days of Exercise per Week: Not on file  . Minutes of Exercise per Session: Not on file  Stress:   . Feeling of Stress : Not on file  Social Connections:   . Frequency of Communication with Friends and Family: Not on file  . Frequency of Social Gatherings with Friends and Family: Not on file  . Attends Religious Services: Not on file  . Active Member of Clubs or Organizations: Not on file  . Attends Archivist Meetings: Not on file  . Marital Status: Not on file   No Known Allergies Family History  Problem Relation Age of Onset  . Hypertension Mother   . Cancer Father        BRAIN TUMOR     Current Outpatient Medications (Cardiovascular):  .  nitroGLYCERIN (NITRODUR - DOSED IN MG/24 HR) 0.2 mg/hr patch, 1/4 patch daily     Current Outpatient Medications (Other):  .  calcium-vitamin D (OSCAL) 250-125 MG-UNIT per tablet, Take 1 tablet by mouth daily. Marland Kitchen  PENNSAID 2 % SOLN, APPLY 2 PUMPS TO AFFECTED AREA TOPICALLY TWICE A DAY    Past medical history, social, surgical and family history all  reviewed in electronic medical record.  No pertanent information unless stated regarding to the chief complaint.   Review of Systems:  No , visual changes, nausea, vomiting, diarrhea, constipation, dizziness, abdominal pain, skin rash, fevers, chills, night sweats, weight loss, swollen lymph nodes, body aches, joint swelling, chest pain, shortness of breath, mood changes. POSITIVE muscle aches, headache  Objective  Blood pressure 124/80, pulse 68, height 5\' 1"  (1.549 m), weight 120 lb (54.4 kg), last menstrual period 04/16/2008, SpO2 99 %.   General: No apparent distress alert and oriented x3 mood and affect normal, dressed appropriately.  HEENT: Pupils equal, extraocular  movements intact  Respiratory: Patient's speak in full sentences and does not appear short of breath  Cardiovascular: No lower extremity edema, non tender, no erythema  Skin: Warm dry intact with no signs of infection or rash on extremities or on axial skeleton.  Abdomen: Soft nontender  Neuro: Cranial nerves II through XII are intact, neurovascularly intact in all extremities with 2+ DTRs and 2+ pulses.  Lymph: No lymphadenopathy of posterior or anterior cervical chain or axillae bilaterally.  Gait normal with good balance and coordination.  MSK:  Non tender with full range of motion and good stability and symmetric strength and tone of shoulders, elbows, wrist, hip, knee and ankles bilaterally.  Neck exam does have some mild loss of lordosis.  Some mild tightness in the parascapular region right greater than left.  Patient does have some decreased range of motion with right-sided rotation of the neck by 5 degrees compared to contralateral side.  Osteopathic findings C2 flexed rotated and side bent right C6 flexed rotated and side bent left T3 extended rotated and side bent right inhaled third rib T9 extended rotated and side bent left L5 flexed rotated and side bent left Sacrum right on right    Impression and Recommendations:     This case required medical decision making of moderate complexity. The above documentation has been reviewed and is accurate and complete [name], DO       Note: This dictation was prepared with Dragon dictation along with smaller phrase technology. Any transcriptional errors that result from this process are unintentional.

## 2019-05-05 NOTE — Assessment & Plan Note (Signed)
Improvement noted.  Discussed with patient in great length to continue the home exercises and icing regimen, we discussed different ergonomic changes still benefit will be more beneficial.  Patient is to increase activity as tolerated.  Patient will follow up with me again in 4 to 8 weeks

## 2019-05-05 NOTE — Patient Instructions (Signed)
Doing better Use bouncy ball  See me in 4-5 weeks

## 2019-05-05 NOTE — Assessment & Plan Note (Signed)
Decision today to treat with OMT was based on Physical Exam  After verbal consent patient was treated with HVLA, ME, FPR techniques in cervical, thoracic, rib,  lumbar and sacral areas  Patient tolerated the procedure well with improvement in symptoms  Patient given exercises, stretches and lifestyle modifications  See medications in patient instructions if given  Patient will follow up in 4-8 weeks 

## 2019-06-02 ENCOUNTER — Ambulatory Visit: Payer: BC Managed Care – PPO | Admitting: Family Medicine

## 2019-06-07 NOTE — Assessment & Plan Note (Signed)
Chronic problem with mild exacerbation,.  Discussed with patient about medical management.  Discussed which activities to do which wants to avoid.  Patient is to increase activity as tolerated.  Follow-up again in 4 to 8 weeks.

## 2019-06-07 NOTE — Assessment & Plan Note (Signed)
Decision today to treat with OMT was based on Physical Exam  After verbal consent patient was treated with HVLA, ME, FPR techniques in cervical, thoracic, rib,  lumbar and sacral areas  Patient tolerated the procedure well with improvement in symptoms  Patient given exercises, stretches and lifestyle modifications  See medications in patient instructions if given  Patient will follow up in 4-8 weeks 

## 2019-06-07 NOTE — Progress Notes (Signed)
[place] Phone: [phone] Subjective:   Dorothy [name], am serving as a scribe for Dr. Hulan Saas. This visit occurred during the SARS-CoV-2 public health emergency.  Safety protocols were in place, including screening questions prior to the visit, additional usage of staff PPE, and extensive cleaning of exam room while observing appropriate contact time as indicated for disinfecting solutions.   I'm seeing this patient by the request  of:  Dorothy Noon, MD  CC: Neck and low back pain follow-up  [id number]  Dorothy [name] is a 64 y.o. female coming in with complaint of back pain. Last seen on 04/01/2019 for OMT. Patient states overall doing much better than we saw him previously.  Still has some mild tightness.  States that it started giving her more discomfort and pain when she went golfing recently.       [name] past medical history on file. Past Surgical History:  Procedure Laterality Date  . CESAREAN SECTION  1994   Social History   Socioeconomic History  . Marital status: Married    Spouse name: Not on file  . Number of children: Not on file  . Years of education: Not on file  . Highest education level: Not on file  Occupational History  . Not on file  Tobacco Use  . Smoking status: Never Smoker  . Smokeless tobacco: Never Used  Substance and Sexual Activity  . Alcohol use: Yes  . Drug use: [name]  . Sexual activity: Yes    Birth control/protection: Post-menopausal  Other Topics Concern  . Not on file  Social History Narrative  . Not on file   Social Determinants of Health   Financial Resource Strain:   . Difficulty of Paying Living Expenses: Not on file  Food Insecurity:   . Worried About Charity fundraiser in the Last Year: Not on file  . Ran Out of Food in the Last Year: Not on file  Transportation Needs:   . Lack of Transportation (Medical): Not on file  . Lack of  Transportation (Non-Medical): Not on file  Physical Activity:   . Days of Exercise per Week: Not on file  . Minutes of Exercise per Session: Not on file  Stress:   . Feeling of Stress : Not on file  Social Connections:   . Frequency of Communication with Friends and Family: Not on file  . Frequency of Social Gatherings with Friends and Family: Not on file  . Attends Religious Services: Not on file  . Active Member of Clubs or Organizations: Not on file  . Attends Archivist Meetings: Not on file  . Marital Status: Not on file   [name] Known Allergies Family History  Problem Relation Age of Onset  . Hypertension Mother   . Cancer Father        BRAIN TUMOR     Current Outpatient Medications (Cardiovascular):  .  nitroGLYCERIN (NITRODUR - DOSED IN MG/24 HR) 0.2 mg/hr patch, 1/4 patch daily     Current Outpatient Medications (Other):  .  calcium-vitamin D (OSCAL) 250-125 MG-UNIT per tablet, Take 1 tablet by mouth daily. Marland Kitchen  PENNSAID 2 % SOLN, APPLY 2 PUMPS TO AFFECTED AREA TOPICALLY TWICE A DAY   Reviewed prior external information including notes and imaging from  primary care provider As well as notes that were available from care everywhere and other healthcare systems.  Past medical history, social, surgical and  family history all reviewed in electronic medical record.  [name] pertanent information unless stated regarding to the chief complaint.   Review of Systems:  [name] headache, visual changes, nausea, vomiting, diarrhea, constipation, dizziness, abdominal pain, skin rash, fevers, chills, night sweats, weight loss, swollen lymph nodes, body aches, joint swelling, chest pain, shortness of breath, mood changes. POSITIVE muscle aches  Objective  Blood pressure 114/76, pulse 68, height 5\' 1"  (1.549 m), weight 122 lb (55.3 kg), last menstrual period 04/16/2008, SpO2 98 %.   General: [name] apparent distress alert and oriented x3 mood and affect normal, dressed appropriately.    HEENT: Pupils equal, extraocular movements intact  Respiratory: Patient's speak in full sentences and does not appear short of breath  Cardiovascular: [name] lower extremity edema, non tender, [name] erythema  Skin: Warm dry intact with [name] signs of infection or rash on extremities or on axial skeleton.  Abdomen: Soft nontender  Neuro: Cranial nerves II through XII are intact, neurovascularly intact in all extremities with 2+ DTRs and 2+ pulses.  Lymph: [name] lymphadenopathy of posterior or anterior cervical chain or axillae bilaterally.  Gait normal with good balance and coordination.  MSK:  Non tender with full range of motion and good stability and symmetric strength and tone of shoulders, elbows, wrist, hip, knee and ankles bilaterally.  Neck: Inspection very mild loss of lordosis. [name] palpable stepoffs. Negative Spurling's maneuver. Full neck range of motion Grip strength and sensation normal in bilateral hands Strength good C4 to T1 distribution [name] sensory change to C4 to T1 Negative Hoffman sign bilaterally Reflexes normal Tightness noted in the paraspinal musculature in the parascapular region  Back Exam:  Inspection: Unremarkable  Motion: Flexion 45 deg, Extension 25 deg, Side Bending to 35 deg bilaterally,  Rotation to 45 deg bilaterally  SLR laying: Negative  XSLR laying: Negative  Palpable tenderness: Patient tightness in the a paraspinal musculature.Marland Kitchen FABER: negative. Sensory change: Gross sensation intact to all lumbar and sacral dermatomes.  Reflexes: 2+ at both patellar tendons, 2+ at achilles tendons, Babinski's downgoing.  Strength at foot  Plantar-flexion: 5/5 Dorsi-flexion: 5/5 Eversion: 5/5 Inversion: 5/5  Leg strength  Quad: 5/5 Hamstring: 5/5 Hip flexor: 5/5 Hip abductors: 5/5  Gait unremarkable.  Osteopathic findings  C2 flexed rotated and side bent right C4 flexed rotated and side bent left C6 flexed rotated and side bent left T3 extended rotated and side bent  right inhaled third rib T9 extended rotated and side bent left L2 flexed rotated and side bent right Sacrum right on right    Impression and Recommendations:     This case required medical decision making of moderate complexity. The above documentation has been reviewed and is accurate and complete [name], DO       Note: This dictation was prepared with Dragon dictation along with smaller phrase technology. Any transcriptional errors that result from this process are unintentional.

## 2019-06-10 ENCOUNTER — Other Ambulatory Visit: Payer: Self-pay

## 2019-06-10 ENCOUNTER — Encounter: Payer: Self-pay | Admitting: Family Medicine

## 2019-06-10 ENCOUNTER — Ambulatory Visit: Payer: BC Managed Care – PPO | Admitting: Family Medicine

## 2019-06-10 VITALS — BP 114/76 | HR 68 | Ht 61.0 in | Wt 122.0 lb

## 2019-06-10 DIAGNOSIS — R519 Headache, unspecified: Secondary | ICD-10-CM | POA: Diagnosis not present

## 2019-06-10 DIAGNOSIS — G4486 Cervicogenic headache: Secondary | ICD-10-CM

## 2019-06-10 DIAGNOSIS — M999 Biomechanical lesion, unspecified: Secondary | ICD-10-CM | POA: Diagnosis not present

## 2019-06-11 DIAGNOSIS — Z1231 Encounter for screening mammogram for malignant neoplasm of breast: Secondary | ICD-10-CM | POA: Diagnosis not present

## 2019-06-19 ENCOUNTER — Ambulatory Visit: Payer: BC Managed Care – PPO | Attending: Internal Medicine

## 2019-06-19 DIAGNOSIS — Z23 Encounter for immunization: Secondary | ICD-10-CM | POA: Insufficient documentation

## 2019-06-19 NOTE — Progress Notes (Signed)
   Covid-19 Vaccination Clinic  Name:  Dorothy [name]    MRN: [id number] DOB: 12-16-1955  06/19/2019  Dorothy [name] was observed post Covid-19 immunization for 15 minutes without incident. She was provided with Vaccine Information Sheet and instruction to access the V-Safe system.   Dorothy [name] was instructed to call 911 with any severe reactions post vaccine: Marland Kitchen Difficulty breathing  . Swelling of face and throat  . A fast heartbeat  . A bad rash all over body  . Dizziness and weakness

## 2019-07-15 ENCOUNTER — Ambulatory Visit: Payer: BC Managed Care – PPO | Attending: Internal Medicine

## 2019-07-15 DIAGNOSIS — Z23 Encounter for immunization: Secondary | ICD-10-CM

## 2019-07-15 NOTE — Progress Notes (Signed)
   Covid-19 Vaccination Clinic  Name:  Dorothy [name]    MRN: [id number] DOB: 22-Aug-1955  07/15/2019  [name] was observed post Covid-19 immunization for 15 minutes without incident. She was provided with Vaccine Information Sheet and instruction to access the V-Safe system.   [name] was instructed to call 911 with any severe reactions post vaccine: Marland Kitchen Difficulty breathing  . Swelling of face and throat  . A fast heartbeat  . A bad rash all over body  . Dizziness and weakness   Immunizations Administered    Name Date Dose VIS Date Route   Pfizer COVID-19 Vaccine 07/15/2019  3:14 PM 0.3 mL 03/27/2019 Intramuscular   Manufacturer: [place]   Lot: [id number]   [place]: [id number]

## 2019-08-05 ENCOUNTER — Ambulatory Visit: Payer: BC Managed Care – PPO | Admitting: Family Medicine

## 2019-08-05 ENCOUNTER — Other Ambulatory Visit: Payer: Self-pay

## 2019-08-05 ENCOUNTER — Encounter: Payer: Self-pay | Admitting: Family Medicine

## 2019-08-05 VITALS — BP 110/70 | HR 68 | Ht 61.0 in | Wt 118.0 lb

## 2019-08-05 DIAGNOSIS — M999 Biomechanical lesion, unspecified: Secondary | ICD-10-CM | POA: Diagnosis not present

## 2019-08-05 DIAGNOSIS — G4486 Cervicogenic headache: Secondary | ICD-10-CM

## 2019-08-05 DIAGNOSIS — R519 Headache, unspecified: Secondary | ICD-10-CM | POA: Diagnosis not present

## 2019-08-05 NOTE — Patient Instructions (Addendum)
Good to see you See me again in 6-8 weeks Have fun in [place]!

## 2019-08-05 NOTE — Assessment & Plan Note (Signed)
Chronic problem with mild exacerbation patient to discuss medication management.  Has been doing fairly well with conservative therapy otherwise.  Responding well to manipulation.  Follow-up again in 4 to 8 weeks.

## 2019-08-05 NOTE — Progress Notes (Signed)
[place] [address] [place] Phone: [phone] Subjective:   I Dorothy [name] am serving as a Education administrator for Dr. Hulan Saas.  This visit occurred during the SARS-CoV-2 public health emergency.  Safety protocols were in place, including screening questions prior to the visit, additional usage of staff PPE, and extensive cleaning of exam room while observing appropriate contact time as indicated for disinfecting solutions.   I'm seeing this patient by the request  of:  Cari Caraway, MD  CC: Neck pain, low back pain follow-up  [id number]  Dorothy [name] is a 64 y.o. female coming in with complaint of neck pain. Last seen 06/10/2019 for OMT. Patient states she is doing well.  Has been able to be more active recently.  Not having as much significant pain.  Describes the pain as a dull, aching sensation with patient does have it.  Some more stiffness in the neck.  Patient has been able to play tennis as well as golf     No past medical history on file. Past Surgical History:  Procedure Laterality Date  . CESAREAN SECTION  1994   Social History   Socioeconomic History  . Marital status: Married    Spouse name: Not on file  . Number of children: Not on file  . Years of education: Not on file  . Highest education level: Not on file  Occupational History  . Not on file  Tobacco Use  . Smoking status: Never Smoker  . Smokeless tobacco: Never Used  Substance and Sexual Activity  . Alcohol use: Yes  . Drug use: No  . Sexual activity: Yes    Birth control/protection: Post-menopausal  Other Topics Concern  . Not on file  Social History Narrative  . Not on file   Social Determinants of Health   Financial Resource Strain:   . Difficulty of Paying Living Expenses:   Food Insecurity:   . Worried About Charity fundraiser in the Last Year:   . Arboriculturist in the Last Year:   Transportation Needs:   . Lexicographer (Medical):   Marland Kitchen Lack of Transportation (Non-Medical):   Physical Activity:   . Days of Exercise per Week:   . Minutes of Exercise per Session:   Stress:   . Feeling of Stress :   Social Connections:   . Frequency of Communication with Friends and Family:   . Frequency of Social Gatherings with Friends and Family:   . Attends Religious Services:   . Active Member of Clubs or Organizations:   . Attends Archivist Meetings:   Marland Kitchen Marital Status:    No Known Allergies Family History  Problem Relation Age of Onset  . Hypertension Mother   . Cancer Father        BRAIN TUMOR     Current Outpatient Medications (Cardiovascular):  .  nitroGLYCERIN (NITRODUR - DOSED IN MG/24 HR) 0.2 mg/hr patch, 1/4 patch daily     Current Outpatient Medications (Other):  .  calcium-vitamin D (OSCAL) 250-125 MG-UNIT per tablet, Take 1 tablet by mouth daily. Marland Kitchen  PENNSAID 2 % SOLN, APPLY 2 PUMPS TO AFFECTED AREA TOPICALLY TWICE A DAY   Reviewed prior external information including notes and imaging from  primary care provider As well as notes that were available from care everywhere and other healthcare systems.  Past medical history, social, surgical and family history all reviewed in electronic medical record.  No pertanent information unless stated regarding to the chief complaint.   Review of Systems:  No , visual changes, nausea, vomiting, diarrhea, constipation, dizziness, abdominal pain, skin rash, fevers, chills, night sweats, weight loss, swollen lymph nodes, body aches, joint swelling, chest pain, shortness of breath, mood changes. POSITIVE muscle aches, headache   Objective  Blood pressure 110/70, pulse 68, height 5\' 1"  (1.549 m), weight 118 lb (53.5 kg), last menstrual period 04/16/2008, SpO2 98 %.   General: No apparent distress alert and oriented x3 mood and affect normal, dressed appropriately.  HEENT: Pupils equal, extraocular movements intact  Respiratory:  Patient's speak in full sentences and does not appear short of breath  Cardiovascular: No lower extremity edema, non tender, no erythema  Neuro: Cranial nerves II through XII are intact, neurovascularly intact in all extremities with 2+ DTRs and 2+ pulses.  Gait normal with good balance and coordination.  MSK:  Non tender with full range of motion and good stability and symmetric strength and tone of shoulders, elbows, wrist, hip, knee and ankles bilaterally.  Neck exam does have some loss of lordosis.  Tightness in the parascapular region right greater than left.  Also tightness of the right sacroiliac joint right greater than left.  Negative straight leg test.  Full range of motion though noted.  Osteopathic findings C2 flexed rotated and side bent right C7 flexed rotated and side bent left T3 extended rotated and side bent right inhaled third rib T5 extended rotated and side bent left L2 flexed rotated and side bent right Sacrum right on right    Impression and Recommendations:     This case required medical decision making of moderate complexity. The above documentation has been reviewed and is accurate and complete [name], DO       Note: This dictation was prepared with Dragon dictation along with smaller phrase technology. Any transcriptional errors that result from this process are unintentional.

## 2019-08-05 NOTE — Assessment & Plan Note (Signed)

## 2019-09-28 ENCOUNTER — Other Ambulatory Visit (HOSPITAL_COMMUNITY)
Admission: RE | Admit: 2019-09-28 | Discharge: 2019-09-28 | Disposition: A | Discharge status: Home / Self Care | Payer: BC Managed Care – PPO | Source: Ambulatory Visit | Attending: Family Medicine | Admitting: Family Medicine

## 2019-09-28 ENCOUNTER — Other Ambulatory Visit: Payer: Self-pay | Admitting: Family Medicine

## 2019-09-28 DIAGNOSIS — Z0000 Encounter for general adult medical examination without abnormal findings: Secondary | ICD-10-CM | POA: Diagnosis not present

## 2019-09-28 DIAGNOSIS — Z124 Encounter for screening for malignant neoplasm of cervix: Secondary | ICD-10-CM | POA: Insufficient documentation

## 2019-09-28 DIAGNOSIS — M8588 Other specified disorders of bone density and structure, other site: Secondary | ICD-10-CM | POA: Diagnosis not present

## 2019-09-30 ENCOUNTER — Other Ambulatory Visit: Payer: Self-pay

## 2019-09-30 ENCOUNTER — Encounter: Payer: Self-pay | Admitting: Family Medicine

## 2019-09-30 ENCOUNTER — Ambulatory Visit: Payer: BC Managed Care – PPO | Admitting: Family Medicine

## 2019-09-30 VITALS — BP 126/78 | HR 66 | Ht 61.0 in | Wt 118.0 lb

## 2019-09-30 DIAGNOSIS — R519 Headache, unspecified: Secondary | ICD-10-CM

## 2019-09-30 DIAGNOSIS — M999 Biomechanical lesion, unspecified: Secondary | ICD-10-CM

## 2019-09-30 DIAGNOSIS — G4486 Cervicogenic headache: Secondary | ICD-10-CM

## 2019-09-30 NOTE — Progress Notes (Signed)
[place] [address] [place] Phone: [phone] Subjective:   I Dorothy [name] am serving as a Education administrator for Dr. Hulan Saas.  This visit occurred during the SARS-CoV-2 public health emergency.  Safety protocols were in place, including screening questions prior to the visit, additional usage of staff PPE, and extensive cleaning of exam room while observing appropriate contact time as indicated for disinfecting solutions.   I'm seeing this patient by the request  of:  Cari Caraway, MD  CC: Back pain, neck pain follow-up  [id number]  Dorothy [name] is a 64 y.o. female coming in with complaint of back and neck pain Patient states she is doing well. Experiencing headaches.  Patient is try to be more active.  Feels that possibly allergies are playing a role.  Denies any radiation down the arms.  Denies any numbness or tingling regularly.  Medications patient has been prescribed: Topical Pennsaid which she has been doing intermittently          Reviewed prior external information including notes and imaging from previsou exam, outside providers and external EMR if available.   As well as notes that were available from care everywhere and other healthcare systems.  Past medical history, social, surgical and family history all reviewed in electronic medical record.  No pertanent information unless stated regarding to the chief complaint.   No past medical history on file.  No Known Allergies   Review of Systems:  No headache, visual changes, nausea, vomiting, diarrhea, constipation, dizziness, abdominal pain, skin rash, fevers, chills, night sweats, weight loss, swollen lymph nodes, body aches, joint swelling, chest pain, shortness of breath, mood changes. POSITIVE muscle aches  Objective  Last menstrual period 04/16/2008.   General: No apparent distress alert and oriented x3 mood and affect normal, dressed appropriately.   HEENT: Pupils equal, extraocular movements intact  Respiratory: Patient's speak in full sentences and does not appear short of breath  Cardiovascular: No lower extremity edema, non tender, no erythema  Neuro: Cranial nerves II through XII are intact, neurovascularly intact in all extremities with 2+ DTRs and 2+ pulses.  Gait normal with good balance and coordination.  MSK:  Non tender with full range of motion and good stability and symmetric strength and tone of shoulders, elbows, wrist, hip, knee and ankles bilaterally.  Back - Normal skin, Spine with normal alignment and no deformity.  No tenderness to vertebral process palpation.  Paraspinous muscles are not tender and without spasm.   Range of motion is full at neck and lumbar sacral regions  Osteopathic findings  C2 flexed rotated and side bent right C6 flexed rotated and side bent left T3 extended rotated and side bent right inhaled rib T9 extended rotated and side bent left L2 flexed rotated and side bent right Sacrum right on right       Assessment and Plan:  Cervicogenic headache Continued mild cervicogenic headaches.  Mild exacerbation since last exam.  We discussed again about the same over-the-counter natural supplementations as well as the topical anti-inflammatories.  Patient likes to avoid oral anti-inflammatories regularly.  We discussed which activities to do which wants to avoid.  Increasing activity slowly.  Follow-up again in 4 to 8 weeks.    Nonallopathic problems  Decision today to treat with OMT was based on Physical Exam  After verbal consent patient was treated with HVLA, ME, FPR techniques in cervical, rib, thoracic, lumbar, and sacral  areas  Patient tolerated the procedure  well with improvement in symptoms  Patient given exercises, stretches and lifestyle modifications  See medications in patient instructions if given  Patient will follow up in 4-8 weeks      The above documentation has been  reviewed and is accurate and complete [name], DO       Note: This dictation was prepared with Dragon dictation along with smaller phrase technology. Any transcriptional errors that result from this process are unintentional.

## 2019-09-30 NOTE — Patient Instructions (Signed)
Yoga wheel Don't go on toes as much with pure bar See me again in 6-8 weeks

## 2019-09-30 NOTE — Assessment & Plan Note (Signed)
Continued mild cervicogenic headaches.  Mild exacerbation since last exam.  We discussed again about the same over-the-counter natural supplementations as well as the topical anti-inflammatories.  Patient likes to avoid oral anti-inflammatories regularly.  We discussed which activities to do which wants to avoid.  Increasing activity slowly.  Follow-up again in 4 to 8 weeks.

## 2019-10-02 LAB — CYTOLOGY - PAP: Diagnosis: NEGATIVE

## 2019-10-22 DIAGNOSIS — L814 Other melanin hyperpigmentation: Secondary | ICD-10-CM | POA: Diagnosis not present

## 2019-10-22 DIAGNOSIS — L720 Epidermal cyst: Secondary | ICD-10-CM | POA: Diagnosis not present

## 2019-10-22 DIAGNOSIS — L570 Actinic keratosis: Secondary | ICD-10-CM | POA: Diagnosis not present

## 2019-10-22 DIAGNOSIS — D225 Melanocytic nevi of trunk: Secondary | ICD-10-CM | POA: Diagnosis not present

## 2019-10-22 DIAGNOSIS — D1801 Hemangioma of skin and subcutaneous tissue: Secondary | ICD-10-CM | POA: Diagnosis not present

## 2019-11-10 DIAGNOSIS — H5213 Myopia, bilateral: Secondary | ICD-10-CM | POA: Diagnosis not present

## 2019-11-11 ENCOUNTER — Encounter: Payer: Self-pay | Admitting: Family Medicine

## 2019-11-11 ENCOUNTER — Ambulatory Visit: Payer: BC Managed Care – PPO | Admitting: Family Medicine

## 2019-11-11 ENCOUNTER — Other Ambulatory Visit: Payer: Self-pay

## 2019-11-11 VITALS — BP 116/84 | HR 65 | Ht 61.0 in | Wt 117.0 lb

## 2019-11-11 DIAGNOSIS — G4486 Cervicogenic headache: Secondary | ICD-10-CM

## 2019-11-11 DIAGNOSIS — G5701 Lesion of sciatic nerve, right lower limb: Secondary | ICD-10-CM | POA: Diagnosis not present

## 2019-11-11 DIAGNOSIS — R519 Headache, unspecified: Secondary | ICD-10-CM

## 2019-11-11 DIAGNOSIS — M999 Biomechanical lesion, unspecified: Secondary | ICD-10-CM | POA: Diagnosis not present

## 2019-11-11 NOTE — Assessment & Plan Note (Signed)
Chronic problem with exacerbation.  Patient does have the vitamin D, topical anti-inflammatories, has done formal physical therapy.  Do not feel advanced imaging is warranted at this time.  Discussed continuing the manipulation.  Follow-up again in 4 to 8 weeks

## 2019-11-11 NOTE — Progress Notes (Signed)
[place] [address] [place] Phone: [phone] Subjective:   I [name] am serving as a Education administrator for Dr. Hulan Saas.  This visit occurred during the SARS-CoV-2 public health emergency.  Safety protocols were in place, including screening questions prior to the visit, additional usage of staff PPE, and extensive cleaning of exam room while observing appropriate contact time as indicated for disinfecting solutions.   I'm seeing this patient by the request  of:  Cari Caraway, MD  CC: Back and neck pain follow-up  [id number]  Dorothy [name] is a 64 y.o. female coming in with complaint of back and neck pain. OMT 09/30/2019. Patient states she is doing ok today.  Some tightness urinating.  Has had some more wrist discomfort with different activities.  Medications patient has been prescribed: Topical Pennsaid  Taking: Most evenings         Reviewed prior external information including notes and imaging from previsou exam, outside providers and external EMR if available.   As well as notes that were available from care everywhere and other healthcare systems.  Past medical history, social, surgical and family history all reviewed in electronic medical record.  No pertanent information unless stated regarding to the chief complaint.   No past medical history on file.  No Known Allergies   Review of Systems:  No headache, visual changes, nausea, vomiting, diarrhea, constipation, dizziness, abdominal pain, skin rash, fevers, chills, night sweats, weight loss, swollen lymph nodes,, joint swelling, chest pain, shortness of breath, mood changes. POSITIVE muscle aches, body aches  Objective  Blood pressure 116/84, pulse 65, height 5\' 1"  (1.549 m), weight 117 lb (53.1 kg), last menstrual period 04/16/2008, SpO2 98 %.   General: No apparent distress alert and oriented x3 mood and affect normal, dressed appropriately.   HEENT: Pupils equal, extraocular movements intact  Respiratory: Patient's speak in full sentences and does not appear short of breath  Cardiovascular: No lower extremity edema, non tender, no erythema  Neuro: Cranial nerves II through XII are intact, neurovascularly intact in all extremities with 2+ DTRs and 2+ pulses.  Gait normal with good balance and coordination.  MSK:  Non tender with full range of motion and good stability and symmetric strength and tone of shoulders, elbows, wrist, hip, knee and ankles bilaterally.   Neck exam does show some very mild loss of lordosis.  Tightness noted all the way up to the occipital region of the neck bilaterally.  Patient does have pain in the parascapular region as well.  No spinous process tenderness.  Negative Spurling's.  5 out of 5 strength of the upper extremities.  Hand arthritis noted though.  Low back shows some tightness noted on the right greater than left in the paraspinal musculature.  Mild over the right sacroiliac joint.  Negative straight leg test.  Lacks last 5 degrees of extension of the back  Osteopathic findings  C2 flexed rotated and side bent right C6 flexed rotated and side bent left T3 extended rotated and side bent right inhaled rib T8 extended rotated and side bent left L5 flexed rotated and side bent left Sacrum right on right       Assessment and Plan:  Cervicogenic headache Chronic problem with exacerbation.  Patient does have the vitamin D, topical anti-inflammatories, has done formal physical therapy.  Do not feel advanced imaging is warranted at this time.  Discussed continuing the manipulation.  Follow-up again in 4 to 8 weeks  Nonallopathic problems  Decision today to treat with OMT was based on Physical Exam  After verbal consent patient was treated with HVLA, ME, FPR techniques in cervical, rib, thoracic, lumbar, and sacral  areas  Patient tolerated the procedure well with improvement in  symptoms  Patient given exercises, stretches and lifestyle modifications  See medications in patient instructions if given  Patient will follow up in 4-8 weeks      The above documentation has been reviewed and is accurate and complete [name], DO       Note: This dictation was prepared with Dragon dictation along with smaller phrase technology. Any transcriptional errors that result from this process are unintentional.

## 2019-11-11 NOTE — Patient Instructions (Addendum)
Good to see you Overall great Continue to monitor wrist and hand Use voltaren See me again in 6-8 weeks

## 2020-01-05 NOTE — Progress Notes (Signed)
[place] Phone: [phone] Subjective:   [name], am serving as a scribe for Dr. Hulan Saas. This visit occurred during the SARS-CoV-2 public health emergency.  Safety protocols were in place, including screening questions prior to the visit, additional usage of staff PPE, and extensive cleaning of exam room while observing appropriate contact time as indicated for disinfecting solutions.   I'm seeing this patient by the request  of:  Cari Caraway, MD  CC: Back and neck pain follow-up  [id number]  Dorothy [name] is a 64 y.o. female coming in with complaint of back and neck pain. OMT 11/11/2019. Patient states overall doing significantly better at this time.  Some mild discomfort from time to time but nothing severe.  Had a massage yesterday which seemed to be more beneficial.  Medications patient has been prescribed: Pennsaid, Nitro patches  Taking: No         Reviewed prior external information including notes and imaging from previsou exam, outside providers and external EMR if available.   As well as notes that were available from care everywhere and other healthcare systems.  Past medical history, social, surgical and family history all reviewed in electronic medical record.  No pertanent information unless stated regarding to the chief complaint.   No past medical history on file.  No Known Allergies   Review of Systems:  No  visual changes, nausea, vomiting, diarrhea, constipation, dizziness, abdominal pain, skin rash, fevers, chills, night sweats, weight loss, swollen lymph nodes, body aches, joint swelling, chest pain, shortness of breath, mood changes. POSITIVE muscle aches, mild headaches  Objective  Blood pressure 128/86, pulse 71, height 5\' 1"  (1.549 m), weight 120 lb (54.4 kg), last menstrual period 04/16/2008, SpO2 99 %.   General: No apparent distress alert and oriented  x3 mood and affect normal, dressed appropriately.  HEENT: Pupils equal, extraocular movements intact  Respiratory: Patient's speak in full sentences and does not appear short of breath  Cardiovascular: No lower extremity edema, non tender, no erythema  Neuro: Cranial nerves II through XII are intact, neurovascularly intact in all extremities with 2+ DTRs and 2+ pulses.  Gait normal with good balance and coordination.  MSK:  Non tender with full range of motion and good stability and symmetric strength and tone of shoulders, elbows, wrist, hip, knee and ankles bilaterally.  Back -mild tightness of the right lower back around the sacroiliac joint.  Mild positive Corky Sox. Neck exam has some mild loss of extension of 5 degrees.  Does have tightness in the parascapular region right greater than left.  Osteopathic findings  C2 flexed rotated and side bent right T6 extended rotated and side bent right inhaled rib L4 flexed rotated and side bent left Sacrum right on right       Assessment and Plan:    Nonallopathic problems  Decision today to treat with OMT was based on Physical Exam  After verbal consent patient was treated with HVLA, ME, FPR techniques in cervical, rib, thoracic, lumbar, and sacral  areas  Patient tolerated the procedure well with improvement in symptoms  Patient given exercises, stretches and lifestyle modifications  See medications in patient instructions if given  Patient will follow up in 4-8 weeks      The above documentation has been reviewed and is accurate and complete [name], DO       Note: This dictation was prepared with Dragon dictation along with  smaller phrase technology. Any transcriptional errors that result from this process are unintentional.

## 2020-01-06 ENCOUNTER — Encounter: Payer: Self-pay | Admitting: Family Medicine

## 2020-01-06 ENCOUNTER — Ambulatory Visit: Payer: BC Managed Care – PPO | Admitting: Family Medicine

## 2020-01-06 ENCOUNTER — Other Ambulatory Visit: Payer: Self-pay

## 2020-01-06 VITALS — BP 128/86 | HR 71 | Ht 61.0 in | Wt 120.0 lb

## 2020-01-06 DIAGNOSIS — G4486 Cervicogenic headache: Secondary | ICD-10-CM

## 2020-01-06 DIAGNOSIS — R519 Headache, unspecified: Secondary | ICD-10-CM | POA: Diagnosis not present

## 2020-01-06 DIAGNOSIS — M999 Biomechanical lesion, unspecified: Secondary | ICD-10-CM | POA: Diagnosis not present

## 2020-01-06 NOTE — Patient Instructions (Signed)
[place]  See me in 7-8 weeks

## 2020-01-06 NOTE — Assessment & Plan Note (Signed)
Mild tightness started in the neck but no significant headaches at this time.  Patient feels like she does not need any true medications.  Patient will be having some vacation her symptoms and hopefully will do relatively well.  Increase activity slowly.  Follow-up again 6 to 8 weeks

## 2020-03-02 ENCOUNTER — Ambulatory Visit: Payer: BC Managed Care – PPO | Admitting: Family Medicine

## 2020-03-14 ENCOUNTER — Ambulatory Visit: Payer: BC Managed Care – PPO | Admitting: Family Medicine

## 2020-03-14 ENCOUNTER — Encounter: Payer: Self-pay | Admitting: Family Medicine

## 2020-03-14 ENCOUNTER — Other Ambulatory Visit: Payer: Self-pay

## 2020-03-14 VITALS — BP 122/78 | HR 65 | Ht 61.0 in | Wt 123.0 lb

## 2020-03-14 DIAGNOSIS — M999 Biomechanical lesion, unspecified: Secondary | ICD-10-CM

## 2020-03-14 DIAGNOSIS — G5701 Lesion of sciatic nerve, right lower limb: Secondary | ICD-10-CM | POA: Diagnosis not present

## 2020-03-14 NOTE — Patient Instructions (Signed)
Business trip took a toll See me again in 6 weeks

## 2020-03-14 NOTE — Assessment & Plan Note (Signed)
Chronic problem with mild increase in exacerbation.  Patient is to do the stretches more often which she has not been doing.  Patient will continue to work on the core strength which patient did make an improvement initially but is having worsening symptoms again.  Discussed icing regimen and home exercises, discussed posture and ergonomics.  Follow-up again in 4 to 8 weeks

## 2020-03-14 NOTE — Progress Notes (Signed)
[place] [address] [place] Phone: [phone] Subjective:   I Dorothy [name] am serving as a Education administrator for Dr. Hulan Saas.  This visit occurred during the SARS-CoV-2 public health emergency.  Safety protocols were in place, including screening questions prior to the visit, additional usage of staff PPE, and extensive cleaning of exam room while observing appropriate contact time as indicated for disinfecting solutions.   I'm seeing this patient by the request  of:  Cari Caraway, MD  CC: Neck and back pain follow-up  [id number]  Dorothy [name] is a 64 y.o. female coming in with complaint of back and, hip, neck pain. OMT 01/06/2020. Patient states she is making progress. Right hip is bothering her more than usual.  Patient states that she has been traveling more regularly.  States that with the traveling having more discomfort from time to time.  Patient has not been doing the exercises as regularly.  Medications patient has been prescribed: Nitro patches, pennsaid Taking: No         Reviewed prior external information including notes and imaging from previsou exam, outside providers and external EMR if available.   As well as notes that were available from care everywhere and other healthcare systems.  Past medical history, social, surgical and family history all reviewed in electronic medical record.  No pertanent information unless stated regarding to the chief complaint.   No past medical history on file.  No Known Allergies   Review of Systems:  No headache, visual changes, nausea, vomiting, diarrhea, constipation, dizziness, abdominal pain, skin rash, fevers, chills, night sweats, weight loss, swollen lymph nodes, body aches, joint swelling, chest pain, shortness of breath, mood changes. POSITIVE muscle aches  Objective  Blood pressure 122/78, pulse 65, height 5\' 1"  (1.549 m), weight 123 lb (55.8 kg), last  menstrual period 04/16/2008, SpO2 98 %.   General: No apparent distress alert and oriented x3 mood and affect normal, dressed appropriately.  HEENT: Pupils equal, extraocular movements intact  Respiratory: Patient's speak in full sentences and does not appear short of breath  Cardiovascular: No lower extremity edema, non tender, no erythema  Neuro: Cranial nerves II through XII are intact, neurovascularly intact in all extremities with 2+ DTRs and 2+ pulses.  Gait normal with good balance and coordination.  MSK:  Non tender with full range of motion and good stability and symmetric strength and tone of shoulders, elbows, wrist, hip, knee and ankles bilaterally.  Back -low back exam still shows the patient has some mild loss of lordosis.  Patient does have fairly good core strength.  Tightness noted with FABER test right greater than left.  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  Patient is to do the stretches more often which she has not been doing.  Patient will continue to work on the core strength which patient did make an improvement initially but is having worsening symptoms again.  Discussed icing regimen and home exercises, discussed posture and ergonomics.  Follow-up again in 4 to 8 weeks   Nonallopathic problems  Decision today to treat with OMT was based on Physical Exam  After verbal consent patient was treated with HVLA, ME, FPR techniques in cervical, rib, thoracic, lumbar, and sacral  areas  Patient tolerated the procedure well with improvement in symptoms  Patient given exercises, stretches and lifestyle modifications  See medications in patient  instructions if given  Patient will follow up in 4-8 weeks     The above documentation has been reviewed and is accurate and complete [name], DO       Note: This dictation was prepared with Dragon dictation along with smaller phrase technology. Any transcriptional errors that result from this process are unintentional.

## 2020-04-22 NOTE — Progress Notes (Signed)
[place] Phone: [phone] Subjective:   Dorothy [name], am serving as a scribe for Dr. Hulan Saas. This visit occurred during the SARS-CoV-2 public health emergency.  Safety protocols were in place, including screening questions prior to the visit, additional usage of staff PPE, and extensive cleaning of exam room while observing appropriate contact time as indicated for disinfecting solutions.   I'm seeing this patient by the request  of:  Cari Caraway, MD  CC: Neck and back pain follow-up new elbow pain  [id number]  Dorothy [name] is a 65 y.o. female coming in with complaint of back and neck pain. OMT 03/14/2020. Patient states that her right hip pain is radiating down into the knee.   Also having right elbow pain with playing tennis and pickle ball. Pain over olecranon that is intermittent. Using a sleeve for pain relief.  Medications patient has been prescribed: None  Taking:         Reviewed prior external information including notes and imaging from previsou exam, outside providers and external EMR if available.   As well as notes that were available from care everywhere and other healthcare systems.  Past medical history, social, surgical and family history all reviewed in electronic medical record.  [name] pertanent information unless stated regarding to the chief complaint.   [name] past medical history on file.  [name] Known Allergies   Review of Systems:  [name] headache, visual changes, nausea, vomiting, diarrhea, constipation, dizziness, abdominal pain, skin rash, fevers, chills, night sweats, weight loss, swollen lymph nodes, body aches, joint swelling, chest pain, shortness of breath, mood changes. POSITIVE muscle aches  Objective  Blood pressure 130/86, pulse 67, height 5\' 1"  (1.549 m), weight 119 lb (54 kg), last menstrual period 04/16/2008, SpO2 99 %.   General: [name] apparent distress  alert and oriented x3 mood and affect normal, dressed appropriately.  HEENT: Pupils equal, extraocular movements intact  Respiratory: Patient's speak in full sentences and does not appear short of breath  Cardiovascular: [name] lower extremity edema, non tender, [name] erythema  Low back exam does have some mild loss of lordosis.  Patient does have some tenderness to palpation noted in the paraspinal musculature on the right side of the back.  Mild tightness with straight leg test on the right compared to left.  Negative Corky Sox for any type of radicular symptoms but mild increase in discomfort over the right sacroiliac joint.  Mild tightness in the right piriformis area.  Right elbow exam shows the patient is tender to palpation more over the tricep.  More pain with resisted extension of the arm.  Minimal discomfort over the lateral epicondylar and [name] discomfort over the medial epicondylar region.  Good grip strength.  Neck exam mild tightness of the neck right greater than left but negative Spurling's.   Osteopathic findings  C4 flexed rotated and side bent right T3 extended rotated and side bent right inhaled rib L4 flexed rotated and side bent right Sacrum right on right       Assessment and Plan:  Sacroiliac joint dysfunction of right side Questionable exacerbation of this with a.  We will get x-rays of the low back secondary to some mild radicular symptoms more in the L4 aspect.  Started on a very low dose of gabapentin if necessary.  Increase activity slowly.  Discussed core stability.  Follow-up with me again in 6 to 8 weeks  Tendinitis of right triceps Patient  is having mild Morton tricep tendinitis.  Patient does do a lot of racquet activities.  Minimal pain over the lateral aspect.  Patient has [name] instability noted.  Patient also does golf [name] pain over the medial epicondylar region.  Patient will continue with compression that she has already tried, home exercises given, topical  anti-inflammatories and icing regimen.  Follow-up again 6 to 8 weeks    Nonallopathic problems  Decision today to treat with OMT was based on Physical Exam  After verbal consent patient was treated with HVLA, ME, FPR techniques in cervical, rib, thoracic, lumbar, and sacral  areas  Patient tolerated the procedure well with improvement in symptoms  Patient given exercises, stretches and lifestyle modifications  See medications in patient instructions if given  Patient will follow up in 4-8 weeks      The above documentation has been reviewed and is accurate and complete Dorothy Pulley, DO       Note: This dictation was prepared with Dragon dictation along with smaller phrase technology. Any transcriptional errors that result from this process are unintentional.

## 2020-04-25 ENCOUNTER — Ambulatory Visit (INDEPENDENT_AMBULATORY_CARE_PROVIDER_SITE_OTHER): Payer: BC Managed Care – PPO

## 2020-04-25 ENCOUNTER — Encounter: Payer: Self-pay | Admitting: Family Medicine

## 2020-04-25 ENCOUNTER — Ambulatory Visit: Payer: BC Managed Care – PPO | Admitting: Family Medicine

## 2020-04-25 ENCOUNTER — Other Ambulatory Visit: Payer: Self-pay

## 2020-04-25 VITALS — BP 130/86 | HR 67 | Ht 61.0 in | Wt 119.0 lb

## 2020-04-25 DIAGNOSIS — M778 Other enthesopathies, not elsewhere classified: Secondary | ICD-10-CM

## 2020-04-25 DIAGNOSIS — M533 Sacrococcygeal disorders, not elsewhere classified: Secondary | ICD-10-CM | POA: Diagnosis not present

## 2020-04-25 DIAGNOSIS — M999 Biomechanical lesion, unspecified: Secondary | ICD-10-CM

## 2020-04-25 DIAGNOSIS — M545 Low back pain, unspecified: Secondary | ICD-10-CM | POA: Diagnosis not present

## 2020-04-25 MED ORDER — GABAPENTIN 100 MG PO CAPS
200.0000 mg | ORAL_CAPSULE | Freq: Every day | ORAL | 0 refills | Status: DC
Start: 2020-04-25 — End: 2020-07-27

## 2020-04-25 NOTE — Assessment & Plan Note (Signed)
Patient is having mild Morton tricep tendinitis.  Patient does do a lot of racquet activities.  Minimal pain over the lateral aspect.  Patient has no instability noted.  Patient also does golf no pain over the medial epicondylar region.  Patient will continue with compression that she has already tried, home exercises given, topical anti-inflammatories and icing regimen.  Follow-up again 6 to 8 weeks

## 2020-04-25 NOTE — Patient Instructions (Addendum)
Xray today Gabapentin 200mg  at night, if makes you tired use 100mg  instead See me in 5-6 weeks

## 2020-04-25 NOTE — Assessment & Plan Note (Signed)
Questionable exacerbation of this with a.  We will get x-rays of the low back secondary to some mild radicular symptoms more in the L4 aspect.  Started on a very low dose of gabapentin if necessary.  Increase activity slowly.  Discussed core stability.  Follow-up with me again in 6 to 8 weeks

## 2020-06-06 ENCOUNTER — Ambulatory Visit: Payer: BC Managed Care – PPO | Admitting: Family Medicine

## 2020-06-07 DIAGNOSIS — H01114 Allergic dermatitis of left upper eyelid: Secondary | ICD-10-CM | POA: Diagnosis not present

## 2020-06-07 DIAGNOSIS — H01112 Allergic dermatitis of right lower eyelid: Secondary | ICD-10-CM | POA: Diagnosis not present

## 2020-06-07 DIAGNOSIS — H01111 Allergic dermatitis of right upper eyelid: Secondary | ICD-10-CM | POA: Diagnosis not present

## 2020-06-07 DIAGNOSIS — H52203 Unspecified astigmatism, bilateral: Secondary | ICD-10-CM | POA: Diagnosis not present

## 2020-06-13 NOTE — Progress Notes (Signed)
[place] Phone: [phone] Subjective:   [name], am serving as a scribe for Dr. Hulan Saas. This visit occurred during the SARS-CoV-2 public health emergency.  Safety protocols were in place, including screening questions prior to the visit, additional usage of staff PPE, and extensive cleaning of exam room while observing appropriate contact time as indicated for disinfecting solutions.   I'm seeing this patient by the request  of:  Cari Caraway, MD  CC: Neck and back pain follow-up  [id number]  Dorothy [name] is a 65 y.o. female coming in with complaint of back and neck pain. OMT 04/25/2020. Patient states that she is having an increase in pain in lateral aspect of right hip that radiates into right knee. During evenings, her hip becomes tight and painful after she sits down to watch tv. Has not been active over this past weekend and her hip still bothered her at night. Is using Advil and gabapentin for pain relief. Has been on gabapentin for one week.   Medications patient has been prescribed: Gabapentin         Reviewed prior external information including notes and imaging from previsou exam, outside providers and external EMR if available.   As well as notes that were available from care everywhere and other healthcare systems.  Past medical history, social, surgical and family history all reviewed in electronic medical record.  No pertanent information unless stated regarding to the chief complaint.   No past medical history on file.  No Known Allergies   Review of Systems:  No headache, visual changes, nausea, vomiting, diarrhea, constipation, dizziness, abdominal pain, skin rash, fevers, chills, night sweats, weight loss, swollen lymph nodes, body aches, joint swelling, chest pain, shortness of breath, mood changes. POSITIVE muscle aches  Objective  Blood pressure 118/78, pulse  65, height 5\' 1"  (1.549 m), weight 119 lb (54 kg), last menstrual period 04/16/2008, SpO2 99 %.   General: No apparent distress alert and oriented x3 mood and affect normal, dressed appropriately.  HEENT: Pupils equal, extraocular movements intact  Respiratory: Patient's speak in full sentences and does not appear short of breath  Cardiovascular: No lower extremity edema, non tender, no erythema  Gait normal with good balance and coordination.  Back -low back exam does show the patient does have some mild loss of lordosis.  There is more tenderness noted over the right side of the paraspinal musculature of the lumbar spine as well as the right sacroiliac joint.  Tightness noted with straight leg test with the hamstring right compared to left.  Patient does have mild tightness of the Corky Sox on the right which is different than previous exam and tightness of the right piriformis area.  Osteopathic findings  C6 flexed rotated and side bent left T3 extended rotated and side bent right inhaled rib T6 extended rotated and side bent left L3 flexed rotated and side bent right Sacrum right on right       Assessment and Plan:  Greater trochanteric bursitis of right hip Patient seems to be having more of a flare at this time but could be actually more secondary to a lumbar radicular symptoms as well.  Discussed the gabapentin still.  We discussed the potential for injections again which patient has responded to previously.  Discussed with patient's about the possibility of the piriformis and continuing the osteopathic manipulation.  Discussed different ergonomic changes that could be beneficial.  Follow-up again  in 4 to 8 weeks    Nonallopathic problems  Decision today to treat with OMT was based on Physical Exam  After verbal consent patient was treated with HVLA, ME, FPR techniques in cervical, rib, thoracic, lumbar, and sacral  areas  Patient tolerated the procedure well with improvement in  symptoms  Patient given exercises, stretches and lifestyle modifications  See medications in patient instructions if given  Patient will follow up in 4-8 weeks      The above documentation has been reviewed and is accurate and complete Dorothy Pulley, DO       Note: This dictation was prepared with Dragon dictation along with smaller phrase technology. Any transcriptional errors that result from this process are unintentional.

## 2020-06-14 ENCOUNTER — Ambulatory Visit: Payer: BC Managed Care – PPO | Admitting: Family Medicine

## 2020-06-14 ENCOUNTER — Other Ambulatory Visit: Payer: Self-pay

## 2020-06-14 ENCOUNTER — Encounter: Payer: Self-pay | Admitting: Family Medicine

## 2020-06-14 VITALS — BP 118/78 | HR 65 | Ht 61.0 in | Wt 119.0 lb

## 2020-06-14 DIAGNOSIS — M7061 Trochanteric bursitis, right hip: Secondary | ICD-10-CM

## 2020-06-14 DIAGNOSIS — M999 Biomechanical lesion, unspecified: Secondary | ICD-10-CM

## 2020-06-14 DIAGNOSIS — G5701 Lesion of sciatic nerve, right lower limb: Secondary | ICD-10-CM

## 2020-06-14 DIAGNOSIS — M533 Sacrococcygeal disorders, not elsewhere classified: Secondary | ICD-10-CM

## 2020-06-14 NOTE — Assessment & Plan Note (Signed)
Patient seems to be having more of a flare at this time but could be actually more secondary to a lumbar radicular symptoms as well.  Discussed the gabapentin still.  We discussed the potential for injections again which patient has responded to previously.  Discussed with patient's about the possibility of the piriformis and continuing the osteopathic manipulation.  Discussed different ergonomic changes that could be beneficial.  Follow-up again in 4 to 8 weeks

## 2020-06-14 NOTE — Patient Instructions (Addendum)
Great to see you  Stay active Try the dry needling  See me in 6 weeks if not better will try GT injection, if not better will try MRI

## 2020-06-16 DIAGNOSIS — Z1231 Encounter for screening mammogram for malignant neoplasm of breast: Secondary | ICD-10-CM | POA: Diagnosis not present

## 2020-07-26 NOTE — Progress Notes (Signed)
[place] Phone: [phone] Subjective:   [name], am serving as a scribe for Dr. Hulan Saas. This visit occurred during the SARS-CoV-2 public health emergency.  Safety protocols were in place, including screening questions prior to the visit, additional usage of staff PPE, and extensive cleaning of exam room while observing appropriate contact time as indicated for disinfecting solutions.   I'm seeing this patient by the request  of:  Cari Caraway, MD  CC: Low back and hip pain  [id number]  Dorothy [name] is a 65 y.o. female coming in with complaint of back and neck pain. OMT 06/14/2020.  Patient states that her pain is worse. Pain begins in glute and radiates into R quad. Patient has to keep weight off of the leg at night due to increase in pain throughout the day. Tried dry needling which did not help.  Patient states that it seems to be constant.  Continues to give her more pain over the buttocks area down the leg in the anterior thigh.  Usually stops at the knee.  Still able to be very active.  Able to still do tenderness and workout on a regular basis.  Medications patient has been prescribed: None  Taking:         Reviewed prior external information including notes and imaging from previsou exam, outside providers and external EMR if available.   As well as notes that were available from care everywhere and other healthcare systems.  Past medical history, social, surgical and family history all reviewed in electronic medical record.  No pertanent information unless stated regarding to the chief complaint.   No past medical history on file.  No Known Allergies   Review of Systems:  No headache, visual changes, nausea, vomiting, diarrhea, constipation, dizziness, abdominal pain, skin rash, fevers, chills, night sweats, weight loss, swollen lymph nodes, body aches, joint swelling, chest  pain, shortness of breath, mood changes. POSITIVE muscle aches moderate  Objective  Blood pressure (!) 150/82, pulse 77, height 5\' 1"  (1.549 m), weight 119 lb (54 kg), last menstrual period 04/16/2008, SpO2 99 %.   General: No apparent distress alert and oriented x3 mood and affect normal, dressed appropriately.  HEENT: Pupils equal, extraocular movements intact  Respiratory: Patient's speak in full sentences and does not appear short of breath  Cardiovascular: No lower extremity edema, non tender, no erythema  Gait normal with good balance and coordination.  MSK:  Non tender with full range of motion and good stability and symmetric strength and tone of shoulders, elbows, wrist, hip, knee and ankles bilaterally.  Back -no back pain has more tenderness to palpation in the paraspinal musculature on the right side of the back.  Mild pain with extension of the back greater than 10 degrees.  Positive Faber on the right.  Severely tender to palpation over the piriformis.  Osteopathic findings  C2 flexed rotated and side bent right C6 flexed rotated and side bent left T3 extended rotated and side bent right inhaled rib T9 extended rotated and side bent left L2 flexed rotated and side bent right Sacrum right on right   After verbal consent patient was prepped with alcohol swab and with a 25-gauge half inch needle injected into the right piriformis tendon sheath proximally with a total of 2 cc of 0.5% Marcaine and 1 cc of Kenalog 40 mg/mL.  No blood loss.  Postinjection instructions given.    Assessment and  Plan:  Piriformis syndrome of right side Patient given injection today.  Tolerated the procedure well.  Full strength noted to 5 minutes after the injection.  Patient was able to start increasing walking.  Concern for the possibility though of a lumbar radiculopathy.  Patient does have a grade 1 spondylolisthesis at L4-L5 that could be contributing.  If worsening pain consider advanced  imaging.  Also can consider the possibility of a greater trochanteric injection.  Follow-up with me again 6 to 8 weeks    Nonallopathic problems  Decision today to treat with OMT was based on Physical Exam  After verbal consent patient was treated with HVLA, ME, FPR techniques in cervical, rib, thoracic, lumbar, and sacral  areas  Patient tolerated the procedure well with improvement in symptoms  Patient given exercises, stretches and lifestyle modifications  See medications in patient instructions if given  Patient will follow up in 6-8 weeks      The above documentation has been reviewed and is accurate and complete [name], DO       Note: This dictation was prepared with Dragon dictation along with smaller phrase technology. Any transcriptional errors that result from this process are unintentional.

## 2020-07-27 ENCOUNTER — Encounter: Payer: Self-pay | Admitting: Family Medicine

## 2020-07-27 ENCOUNTER — Ambulatory Visit: Payer: BC Managed Care – PPO | Admitting: Family Medicine

## 2020-07-27 ENCOUNTER — Other Ambulatory Visit: Payer: Self-pay

## 2020-07-27 VITALS — BP 150/82 | HR 77 | Ht 61.0 in | Wt 119.0 lb

## 2020-07-27 DIAGNOSIS — M9904 Segmental and somatic dysfunction of sacral region: Secondary | ICD-10-CM | POA: Diagnosis not present

## 2020-07-27 DIAGNOSIS — M9901 Segmental and somatic dysfunction of cervical region: Secondary | ICD-10-CM

## 2020-07-27 DIAGNOSIS — M9903 Segmental and somatic dysfunction of lumbar region: Secondary | ICD-10-CM | POA: Diagnosis not present

## 2020-07-27 DIAGNOSIS — M9902 Segmental and somatic dysfunction of thoracic region: Secondary | ICD-10-CM | POA: Diagnosis not present

## 2020-07-27 DIAGNOSIS — G5701 Lesion of sciatic nerve, right lower limb: Secondary | ICD-10-CM

## 2020-07-27 DIAGNOSIS — M9908 Segmental and somatic dysfunction of rib cage: Secondary | ICD-10-CM

## 2020-07-27 NOTE — Patient Instructions (Signed)
Inject R piriformis today  See me again in 5-7 weeks

## 2020-07-27 NOTE — Assessment & Plan Note (Signed)
Patient given injection today.  Tolerated the procedure well.  Full strength noted to 5 minutes after the injection.  Patient was able to start increasing walking.  Concern for the possibility though of a lumbar radiculopathy.  Patient does have a grade 1 spondylolisthesis at L4-L5 that could be contributing.  If worsening pain consider advanced imaging.  Also can consider the possibility of a greater trochanteric injection.  Follow-up with me again 6 to 8 weeks

## 2020-08-31 NOTE — Progress Notes (Signed)
[place] Phone: [phone] Subjective:   Dorothy Dorothy [name], am serving as a scribe for Dr. Hulan [name]. This visit occurred during the SARS-CoV-2 public health emergency.  Safety protocols were in place, including screening questions prior to the visit, additional usage of staff PPE, and extensive cleaning of exam room while observing appropriate contact time as indicated for disinfecting solutions.   I'm seeing this patient by the request  of:  Dorothy Caraway, MD  CC: Right hip pain, back pain, foot numbness  SEG:[id number]  Dorothy Dorothy [name] is a 65 y.o. female coming in with complaint of back and neck pain. OMT 07/27/2020. Piriformis injection performed last visit as well. Patient states that she did not get any relief from the injection. Right foot is "pins and needles" at night. Is still able to be active as she does not have pain with activity but more so at rest.       Patient did have x-rays taken of the lumbar spine in January 2022.  X-rays show the patient had a grade 1 anterior listhesis at L4-L5 but otherwise fairly unremarkable.   Reviewed prior external information including notes and imaging from previsou exam, outside providers and external EMR if available.   As well as notes that were available from care everywhere and other healthcare systems.  Past medical history, social, surgical and family history all reviewed in electronic medical record.  Dorothy [name] pertanent information unless stated regarding to the chief complaint.   Dorothy [name] past medical history on file.  Dorothy [name] Known Allergies   Review of Systems:  Dorothy [name] headache, visual changes, nausea, vomiting, diarrhea, constipation, dizziness, abdominal pain, skin rash, fevers, chills, night sweats, weight loss, swollen lymph nodes, body aches, joint swelling, chest pain, shortness of breath, mood changes. POSITIVE muscle aches  Objective  Blood pressure 128/82,  pulse 68, height 5\' 1"  (1.549 m), weight 118 lb (53.5 kg), last menstrual period 04/16/2008, SpO2 99 %.   General: Dorothy [name] apparent distress alert and oriented x3 mood and affect normal, dressed appropriately.  HEENT: Pupils equal, extraocular movements intact  Respiratory: Patient's speak in full sentences and does not appear short of breath  Cardiovascular: Dorothy [name] lower extremity edema, non tender, Dorothy [name] erythema  Gait normal with good balance and coordination.  MSK:  Non tender with full range of motion and good stability and symmetric strength and tone of shoulders, elbows, wrist, hip, knee and ankles bilaterally.  Back -right back exam shows patient does have unfortunately an increase in tightness.  Radicular symptoms down in the L4 and L5 distribution.  Dorothy [name] significant weakness but patient states that it feels heavier than the contralateral side.   Osteopathic findings  C2 flexed rotated and side bent right C6 flexed rotated and side bent left T9 extended rotated and side bent left with inhaled.      Assessment and Plan:  BACK PAIN, LUMBAR Questionable worsening pain.  Patient did not respond well to the piriformis injection.  Likely secondary to more of a lumbar radiculopathy.  Patient does have unfortunately anterior listhesis at L4-L5 that is consistent with patient's symptoms.  Concern for nerve root impingement.  We will get MRI.  Depending on findings patient would be a candidate for possible injections.  Have discussed the potential for different medications but patient wants to avoid too many of them if possible.  Tried manipulation of the upper back but avoided the lower back at the moment.  Follow-up with  me again after imaging    Nonallopathic problems  Decision today to treat with OMT was based on Physical Exam  After verbal consent patient was treated with HVLA, ME, FPR techniques in cervical, rib, thoracic,  areas avoided lower back secondary to radicular symptoms  Patient  tolerated the procedure well with improvement in symptoms  Patient given exercises, stretches and lifestyle modifications  See medications in patient instructions if given  Patient will follow up in 4-8 weeks     The above documentation has been reviewed and is accurate and complete Dorothy Pulley, DO       Note: This dictation was prepared with Dragon dictation along with smaller phrase technology. Any transcriptional errors that result from this process are unintentional.

## 2020-09-01 ENCOUNTER — Ambulatory Visit: Payer: BC Managed Care – PPO | Admitting: Family Medicine

## 2020-09-01 ENCOUNTER — Encounter: Payer: Self-pay | Admitting: Family Medicine

## 2020-09-01 ENCOUNTER — Other Ambulatory Visit: Payer: Self-pay

## 2020-09-01 VITALS — BP 128/82 | HR 68 | Ht 61.0 in | Wt 118.0 lb

## 2020-09-01 DIAGNOSIS — G8929 Other chronic pain: Secondary | ICD-10-CM

## 2020-09-01 DIAGNOSIS — M9901 Segmental and somatic dysfunction of cervical region: Secondary | ICD-10-CM

## 2020-09-01 DIAGNOSIS — M545 Low back pain, unspecified: Secondary | ICD-10-CM | POA: Diagnosis not present

## 2020-09-01 DIAGNOSIS — M9902 Segmental and somatic dysfunction of thoracic region: Secondary | ICD-10-CM | POA: Diagnosis not present

## 2020-09-01 DIAGNOSIS — M9908 Segmental and somatic dysfunction of rib cage: Secondary | ICD-10-CM

## 2020-09-01 NOTE — Patient Instructions (Signed)
Good to see you MRI lumbar [phone] See me again in 6 weeks we will talk before

## 2020-09-01 NOTE — Assessment & Plan Note (Signed)
Questionable worsening pain.  Patient did not respond well to the piriformis injection.  Likely secondary to more of a lumbar radiculopathy.  Patient does have unfortunately anterior listhesis at L4-L5 that is consistent with patient's symptoms.  Concern for nerve root impingement.  We will get MRI.  Depending on findings patient would be a candidate for possible injections.  Have discussed the potential for different medications but patient wants to avoid too many of them if possible.  Tried manipulation of the upper back but avoided the lower back at the moment.  Follow-up with me again after imaging

## 2020-09-03 ENCOUNTER — Other Ambulatory Visit: Payer: Self-pay

## 2020-09-03 ENCOUNTER — Ambulatory Visit
Admission: RE | Admit: 2020-09-03 | Discharge: 2020-09-03 | Disposition: A | Discharge status: Home / Self Care | Payer: BC Managed Care – PPO | Source: Ambulatory Visit | Attending: Family Medicine | Admitting: Family Medicine

## 2020-09-03 DIAGNOSIS — M545 Low back pain, unspecified: Secondary | ICD-10-CM

## 2020-09-03 DIAGNOSIS — G8929 Other chronic pain: Secondary | ICD-10-CM

## 2020-09-03 DIAGNOSIS — M48061 Spinal stenosis, lumbar region without neurogenic claudication: Secondary | ICD-10-CM | POA: Diagnosis not present

## 2020-09-06 ENCOUNTER — Other Ambulatory Visit: Payer: Self-pay

## 2020-09-06 ENCOUNTER — Encounter: Payer: Self-pay | Admitting: Family Medicine

## 2020-09-06 DIAGNOSIS — M5416 Radiculopathy, lumbar region: Secondary | ICD-10-CM

## 2020-09-06 NOTE — Telephone Encounter (Signed)
Patient would like to proceed with epidural. Can this be ordered for her?

## 2020-09-07 ENCOUNTER — Ambulatory Visit: Payer: BC Managed Care – PPO | Admitting: Family Medicine

## 2020-09-13 ENCOUNTER — Other Ambulatory Visit: Payer: Self-pay

## 2020-09-13 ENCOUNTER — Ambulatory Visit
Admission: RE | Admit: 2020-09-13 | Discharge: 2020-09-13 | Disposition: A | Discharge status: Home / Self Care | Payer: BC Managed Care – PPO | Source: Ambulatory Visit | Attending: Family Medicine | Admitting: Family Medicine

## 2020-09-13 ENCOUNTER — Other Ambulatory Visit: Payer: Self-pay | Admitting: Family Medicine

## 2020-09-13 DIAGNOSIS — M5416 Radiculopathy, lumbar region: Secondary | ICD-10-CM

## 2020-09-13 DIAGNOSIS — M4727 Other spondylosis with radiculopathy, lumbosacral region: Secondary | ICD-10-CM | POA: Diagnosis not present

## 2020-09-13 DIAGNOSIS — M25551 Pain in right hip: Secondary | ICD-10-CM | POA: Diagnosis not present

## 2020-09-13 MED ORDER — METHYLPREDNISOLONE ACETATE 40 MG/ML INJ SUSP (RADIOLOG
80.0000 mg | Freq: Once | INTRAMUSCULAR | Status: AC
  Administered 2020-09-13: 80 mg via EPIDURAL

## 2020-09-13 MED ORDER — IOPAMIDOL (ISOVUE-M 200) INJECTION 41%
1.0000 mL | Freq: Once | INTRAMUSCULAR | Status: AC
  Administered 2020-09-13: 1 mL via EPIDURAL

## 2020-09-13 NOTE — Discharge Instructions (Signed)
Post Procedure Spinal Discharge Instruction Sheet  1. You may resume a regular diet and any medications that you routinely take (including pain medications).  2. No driving day of procedure.  3. Light activity throughout the rest of the day.  Do not do any strenuous work, exercise, bending or lifting.  The day following the procedure, you can resume normal physical activity but you should refrain from exercising or physical therapy for at least three days thereafter.   Common Side Effects:   Headaches- take your usual medications as directed by your physician.  Increase your fluid intake.  Caffeinated beverages may be helpful.  Lie flat in bed until your headache resolves.   Restlessness or inability to sleep- you may have trouble sleeping for the next few days.  Ask your referring physician if you need any medication for sleep.   Facial flushing or redness- should subside within a few days.   Increased pain- a temporary increase in pain a day or two following your procedure is not unusual.  Take your pain medication as prescribed by your referring physician.   Leg cramps  Please contact our office at [phone] for the following symptoms:  Fever greater than 100 degrees.  Headaches unresolved with medication after 2-3 days.  Increased swelling, pain, or redness at injection site.   Thank you for visiting [facility] today.  

## 2020-10-04 DIAGNOSIS — Z0000 Encounter for general adult medical examination without abnormal findings: Secondary | ICD-10-CM | POA: Diagnosis not present

## 2020-10-04 DIAGNOSIS — Z9189 Other specified personal risk factors, not elsewhere classified: Secondary | ICD-10-CM | POA: Diagnosis not present

## 2020-10-12 NOTE — Progress Notes (Signed)
  Dorothy [name] Phone: [phone] Subjective:   Dorothy [name], am serving as a scribe for Dr. Hulan Saas.  I'm seeing this patient by the request  of:  Cari Caraway, MD  CC: Back and neck pain follow-up  MCN:[id number]  Dorothy [name] is a 65 y.o. female coming in with complaint of back and neck pain. OMT 09/01/2020. Patient states that the epidural did help and she feels pretty good and no complaints patient has been able to increase activity significantly.  Still working out fairly regularly.  Medications patient has been prescribed: None        History reviewed. No pertinent past medical history.  No Known Allergies   Review of Systems:  No headache, visual changes, nausea, vomiting, diarrhea, constipation, dizziness, abdominal pain, skin rash, fevers, chills, night sweats, weight loss, swollen lymph nodes, body aches, joint swelling, chest pain, shortness of breath, mood changes. POSITIVE muscle aches  Objective  Blood pressure 112/62, pulse 66, height 5\' 1"  (1.549 m), weight 117 lb (53.1 kg), last menstrual period 04/16/2008, SpO2 98 %.   General: No apparent distress alert and oriented x3 mood and affect normal, dressed appropriately.  HEENT: Pupils equal, extraocular movements intact  Respiratory: Patient's speak in full sentences and does not appear short of breath  Cardiovascular: No lower extremity edema, non tender, no erythema  Back exam does have some tenderness to palpation in the thoracolumbar juncture. Her recent pain over the sacroiliac joint likely on the left.  There is no does have tightness noted with any sidebending.  Mild crepitus noted.  Negative Spurling's  Osteopathic findings  C3 flexed rotated and side bent left T5 extended rotated and side bent right inhaled rib T11 extended rotated and side bent left L2 flexed rotated and side bent right Sacrum right on right      Assessment and Plan:  BACK PAIN, LUMBAR Patient has had lumbar radiculopathy.  Discussed icing regimen and home exercises, which activities to do which wants to avoid.  Patient is feeling much better after the epidural.  Patient to follow-up with me again in 6 to 8 weeks.  Responds very well to isotonic manipulation.  Cervicogenic headache Discussed with patient about icing regimen and home exercises.  Increase activity slowly.  Discussed which activities to doing which wants to avoid.  Follow-up again in 6 to 8 weeks   Nonallopathic problems  Decision today to treat with OMT was based on Physical Exam  After verbal consent patient was treated with HVLA, ME, FPR techniques in cervical, rib, thoracic, lumbar, and sacral  areas  Patient tolerated the procedure well with improvement in symptoms  Patient given exercises, stretches and lifestyle modifications  See medications in patient instructions if given  Patient will follow up in 4-8 weeks      The above documentation has been reviewed and is accurate and complete [name], DO        Note: This dictation was prepared with Dragon dictation along with smaller phrase technology. Any transcriptional errors that result from this process are unintentional.

## 2020-10-13 ENCOUNTER — Encounter: Payer: Self-pay | Admitting: Family Medicine

## 2020-10-13 ENCOUNTER — Other Ambulatory Visit: Payer: Self-pay

## 2020-10-13 ENCOUNTER — Ambulatory Visit: Payer: BC Managed Care – PPO | Admitting: Family Medicine

## 2020-10-13 VITALS — BP 112/62 | HR 66 | Ht 61.0 in | Wt 117.0 lb

## 2020-10-13 DIAGNOSIS — M9902 Segmental and somatic dysfunction of thoracic region: Secondary | ICD-10-CM | POA: Diagnosis not present

## 2020-10-13 DIAGNOSIS — M9901 Segmental and somatic dysfunction of cervical region: Secondary | ICD-10-CM | POA: Diagnosis not present

## 2020-10-13 DIAGNOSIS — M9908 Segmental and somatic dysfunction of rib cage: Secondary | ICD-10-CM | POA: Diagnosis not present

## 2020-10-13 DIAGNOSIS — M545 Low back pain, unspecified: Secondary | ICD-10-CM | POA: Diagnosis not present

## 2020-10-13 DIAGNOSIS — G4486 Cervicogenic headache: Secondary | ICD-10-CM

## 2020-10-13 DIAGNOSIS — M9903 Segmental and somatic dysfunction of lumbar region: Secondary | ICD-10-CM | POA: Diagnosis not present

## 2020-10-13 DIAGNOSIS — M9904 Segmental and somatic dysfunction of sacral region: Secondary | ICD-10-CM

## 2020-10-13 DIAGNOSIS — G8929 Other chronic pain: Secondary | ICD-10-CM

## 2020-10-13 NOTE — Patient Instructions (Signed)
Good to see you See me again in  

## 2020-10-13 NOTE — Assessment & Plan Note (Signed)
Patient has had lumbar radiculopathy.  Discussed icing regimen and home exercises, which activities to do which wants to avoid.  Patient is feeling much better after the epidural.  Patient to follow-up with me again in 6 to 8 weeks.  Responds very well to isotonic manipulation.

## 2020-10-13 NOTE — Assessment & Plan Note (Signed)
Discussed with patient about icing regimen and home exercises.  Increase activity slowly.  Discussed which activities to doing which wants to avoid.  Follow-up again in 6 to 8 weeks

## 2020-10-24 DIAGNOSIS — L821 Other seborrheic keratosis: Secondary | ICD-10-CM | POA: Diagnosis not present

## 2020-10-24 DIAGNOSIS — D2272 Melanocytic nevi of left lower limb, including hip: Secondary | ICD-10-CM | POA: Diagnosis not present

## 2020-10-24 DIAGNOSIS — D225 Melanocytic nevi of trunk: Secondary | ICD-10-CM | POA: Diagnosis not present

## 2020-10-24 DIAGNOSIS — L814 Other melanin hyperpigmentation: Secondary | ICD-10-CM | POA: Diagnosis not present

## 2020-12-07 NOTE — Progress Notes (Signed)
[facility] [place] Phone: [phone] Subjective:   Dorothy Dorothy [name], am serving as a scribe for Dr. Hulan Saas.  This visit occurred during the SARS-CoV-2 public health emergency.  Safety protocols were in place, including screening questions prior to the visit, additional usage of staff PPE, and extensive cleaning of exam room while observing appropriate contact time as indicated for disinfecting solutions.    I'm seeing this patient by the request  of:  Cari Caraway, MD  CC: Right hip and upper back pain  [id number]  Dorothy Dorothy [name] is a 65 y.o. female coming in with complaint of back and neck pain. OMT 10/13/2020. Patient states that Right hip is a little tight but back is doing well.  Patient has noticed a lot of right-sided pain.  Still has difficulty sleeping on the right side.  Feels that this is more the muscular aspect in the back at this point.  Dorothy [name] radicular symptoms down the leg.  Neurovascular intact  Medications patient has been prescribed: None  Taking:         Reviewed prior external information including notes and imaging from previsou exam, outside providers and external EMR if available.   As well as notes that were available from care everywhere and other healthcare systems.  Past medical history, social, surgical and family history all reviewed in electronic medical record.  Dorothy [name] pertanent information unless stated regarding to the chief complaint.   Dorothy [name] past medical history on file.  Dorothy [name] Known Allergies   Review of Systems:  Dorothy [name] headache, visual changes, nausea, vomiting, diarrhea, constipation, dizziness, abdominal pain, skin rash, fevers, chills, night sweats, weight loss, swollen lymph nodes, body aches, joint swelling, chest pain, shortness of breath, mood changes. POSITIVE muscle aches  Objective  Blood pressure 126/84, pulse 72, height '5\' 1"'$  (1.549 m), weight 117 lb (53.1 kg), last  menstrual period 04/16/2008, SpO2 98 %.   General: Dorothy [name] apparent distress alert and oriented x3 mood and affect normal, dressed appropriately.  HEENT: Pupils equal, extraocular movements intact  Respiratory: Patient's speak in full sentences and does not appear short of breath  Cardiovascular: Dorothy [name] lower extremity edema, non tender, Dorothy [name] erythema  Neck exam does show the patient does have some mild loss of lordosis.  Limited sidebending bilaterally.  Mild crepitus noted.  Negative straight leg test.  Tightness noted in the parascapular region.  Low back exam still has tenderness over the piriformis as well as over the gluteal tendon.  Mild pain or moderate pain over the greater trochanteric area.  Mild tightness of the lower back.  Osteopathic findings  C5 flexed rotated and side bent left T5 extended rotated and side bent right inhaled rib L5 flexed rotated and side bent left Sacrum right on right       Assessment and Plan:  Cervicogenic headache Mild tightness noted in the upper back again.  Discussed icing regimen and home exercises.  Discussed posture and different stretches to do after repetitive activities such as pickleball follow-up with me again in 6 to 8 weeks  Greater trochanteric bursitis of right hip The patient still has some mild discomfort and inflammation.  Discussed the potential of repeating the injection.  Patient has not had 1 in quite some time.  Do feel that patient was more of a lumbar radiculopathy.  Patient wants to hold on the injection and will call us if necessary.   Nonallopathic problems  Decision today to treat with  OMT was based on Physical Exam  After verbal consent patient was treated with HVLA, ME, FPR techniques in cervical, rib, thoracic, lumbar, and sacral  areas  Patient tolerated the procedure well with improvement in symptoms  Patient given exercises, stretches and lifestyle modifications  See medications in patient instructions if  given  Patient will follow up in 8 weeks      The above documentation has been reviewed and is accurate and complete [name], DO        Note: This dictation was prepared with Dragon dictation along with smaller phrase technology. Any transcriptional errors that result from this process are unintentional.

## 2020-12-08 ENCOUNTER — Other Ambulatory Visit: Payer: Self-pay

## 2020-12-08 ENCOUNTER — Encounter: Payer: Self-pay | Admitting: Family Medicine

## 2020-12-08 ENCOUNTER — Ambulatory Visit: Payer: BC Managed Care – PPO | Admitting: Family Medicine

## 2020-12-08 VITALS — BP 126/84 | HR 72 | Ht 61.0 in | Wt 117.0 lb

## 2020-12-08 DIAGNOSIS — M9901 Segmental and somatic dysfunction of cervical region: Secondary | ICD-10-CM | POA: Diagnosis not present

## 2020-12-08 DIAGNOSIS — M9904 Segmental and somatic dysfunction of sacral region: Secondary | ICD-10-CM

## 2020-12-08 DIAGNOSIS — M9903 Segmental and somatic dysfunction of lumbar region: Secondary | ICD-10-CM | POA: Diagnosis not present

## 2020-12-08 DIAGNOSIS — M9902 Segmental and somatic dysfunction of thoracic region: Secondary | ICD-10-CM | POA: Diagnosis not present

## 2020-12-08 DIAGNOSIS — G4486 Cervicogenic headache: Secondary | ICD-10-CM

## 2020-12-08 DIAGNOSIS — M7061 Trochanteric bursitis, right hip: Secondary | ICD-10-CM

## 2020-12-08 NOTE — Assessment & Plan Note (Signed)
The patient still has some mild discomfort and inflammation.  Discussed the potential of repeating the injection.  Patient has not had 1 in quite some time.  Do feel that patient was more of a lumbar radiculopathy.  Patient wants to hold on the injection and will call us if necessary.

## 2020-12-08 NOTE — Patient Instructions (Addendum)
Good to see you  Do not beat up on too many people in pickleball See me again in 8 weeks

## 2020-12-08 NOTE — Assessment & Plan Note (Signed)
Mild tightness noted in the upper back again.  Discussed icing regimen and home exercises.  Discussed posture and different stretches to do after repetitive activities such as pickleball follow-up with me again in 6 to 8 weeks

## 2021-01-10 ENCOUNTER — Encounter: Payer: Self-pay | Admitting: Family Medicine

## 2021-01-11 ENCOUNTER — Other Ambulatory Visit: Payer: Self-pay

## 2021-01-11 DIAGNOSIS — M5416 Radiculopathy, lumbar region: Secondary | ICD-10-CM

## 2021-01-16 ENCOUNTER — Ambulatory Visit
Admission: RE | Admit: 2021-01-16 | Discharge: 2021-01-16 | Disposition: A | Discharge status: Home / Self Care | Payer: BC Managed Care – PPO | Source: Ambulatory Visit | Attending: Family Medicine | Admitting: Family Medicine

## 2021-01-16 ENCOUNTER — Other Ambulatory Visit: Payer: Self-pay

## 2021-01-16 DIAGNOSIS — M5416 Radiculopathy, lumbar region: Secondary | ICD-10-CM

## 2021-01-16 DIAGNOSIS — M4727 Other spondylosis with radiculopathy, lumbosacral region: Secondary | ICD-10-CM | POA: Diagnosis not present

## 2021-01-16 MED ORDER — METHYLPREDNISOLONE ACETATE 40 MG/ML INJ SUSP (RADIOLOG
80.0000 mg | Freq: Once | INTRAMUSCULAR | Status: AC
  Administered 2021-01-16: 80 mg via EPIDURAL

## 2021-01-16 MED ORDER — IOPAMIDOL (ISOVUE-M 200) INJECTION 41%
1.0000 mL | Freq: Once | INTRAMUSCULAR | Status: AC
  Administered 2021-01-16: 1 mL via EPIDURAL

## 2021-01-16 NOTE — Discharge Instructions (Signed)

## 2021-02-02 ENCOUNTER — Ambulatory Visit: Payer: BC Managed Care – PPO | Admitting: Family Medicine

## 2021-02-20 ENCOUNTER — Ambulatory Visit: Payer: BC Managed Care – PPO | Admitting: Family Medicine

## 2021-02-21 ENCOUNTER — Ambulatory Visit: Payer: BC Managed Care – PPO | Admitting: Sports Medicine

## 2021-02-21 ENCOUNTER — Other Ambulatory Visit: Payer: Self-pay

## 2021-02-21 VITALS — BP 120/70 | HR 72 | Ht 61.0 in | Wt 122.0 lb

## 2021-02-21 DIAGNOSIS — M9908 Segmental and somatic dysfunction of rib cage: Secondary | ICD-10-CM

## 2021-02-21 DIAGNOSIS — M9905 Segmental and somatic dysfunction of pelvic region: Secondary | ICD-10-CM

## 2021-02-21 DIAGNOSIS — M9903 Segmental and somatic dysfunction of lumbar region: Secondary | ICD-10-CM | POA: Diagnosis not present

## 2021-02-21 DIAGNOSIS — M546 Pain in thoracic spine: Secondary | ICD-10-CM

## 2021-02-21 DIAGNOSIS — M9902 Segmental and somatic dysfunction of thoracic region: Secondary | ICD-10-CM | POA: Diagnosis not present

## 2021-02-21 DIAGNOSIS — G8929 Other chronic pain: Secondary | ICD-10-CM | POA: Diagnosis not present

## 2021-02-21 DIAGNOSIS — M9901 Segmental and somatic dysfunction of cervical region: Secondary | ICD-10-CM

## 2021-02-21 NOTE — Progress Notes (Signed)
   [place] Phone: [phone]   Assessment and Plan:     1. Chronic bilateral thoracic back pain 2. Somatic dysfunction of cervical region 3. Somatic dysfunction of thoracic region 4. Somatic dysfunction of lumbar region 5. Somatic dysfunction of pelvic region 6. Somatic dysfunction of rib region -Chronic with exacerbation, subsequent visit - Recurrence of typical musculoskeletal complaints with worst being in thoracic spine today without radicular symptoms - Patient has received moderate benefit from OMT before.  Elects for repeat today.  Tolerated well per note below - Start physical therapy for chronic thoracic pain.  Referral sent at patient request so that she can have dry needling done   Decision today to treat with OMT was based on Physical Exam   After verbal consent patient was treated with HVLA (high velocity low amplitude), ME (muscle energy), FPR (flex positional release), ST (soft tissue), PC/PD (Pelvic Compression/ Pelvic Decompression) techniques in cervical, rib, thoracic, lumbar, and pelvic areas. Patient tolerated the procedure well with improvement in symptoms.  Patient educated on potential side effects of soreness and recommended to rest, hydrate, and use Tylenol as needed for pain control.   Pertinent previous records reviewed include none   Follow Up: 6 to 8 weeks for repeat OMT   Subjective:   I, Dorothy [name], am serving as a scribe for Dr. Glennon [name]  Chief Complaint: right hip and upper back pain   HPI:   02/21/21 Patient is a 65 year old female presenting with right hip and upper back pain. Patient was last seen by Dr. Tamala [name] on 12/08/20 for this reason and had OMT. Today patient states that her hip has been doing great since the injection, but neck is def ready for manipulation.  Relevant Historical Information: None pertinent  Additional pertinent review of systems  negative.  No current outpatient medications on file.   No current facility-administered medications for this visit.      Objective:     Vitals:   02/21/21 1417  BP: 120/70  Pulse: 72  SpO2: 98%  Weight: 122 lb (55.3 kg)  Height: 5\' 1"  (1.549 m)      Body mass index is 23.05 kg/m.    Physical Exam:     General: Well-appearing, cooperative, sitting comfortably in no acute distress.   OMT Physical Exam:  ASIS Compression Test: Positive Right Cervical: TTP paraspinal, C4 RRSL Rib: Bilateral elevated first rib with TTP Thoracic: TTP paraspinal, T5-10 RRSL Lumbar: TTP paraspinal, L1-3 RLSR Pelvis: Right anterior innominate without flare  Electronically signed by:  Dorothy NashMarguerita [name] Sports Medicine 2:39 PM 02/21/21

## 2021-02-21 NOTE — Patient Instructions (Addendum)
Good to see you  Referral for PT dry needling

## 2021-03-01 ENCOUNTER — Other Ambulatory Visit: Payer: Self-pay

## 2021-03-01 ENCOUNTER — Ambulatory Visit: Payer: BC Managed Care – PPO | Attending: Sports Medicine

## 2021-03-01 DIAGNOSIS — M9908 Segmental and somatic dysfunction of rib cage: Secondary | ICD-10-CM | POA: Insufficient documentation

## 2021-03-01 DIAGNOSIS — M5440 Lumbago with sciatica, unspecified side: Secondary | ICD-10-CM | POA: Insufficient documentation

## 2021-03-01 DIAGNOSIS — M546 Pain in thoracic spine: Secondary | ICD-10-CM | POA: Insufficient documentation

## 2021-03-01 DIAGNOSIS — M9901 Segmental and somatic dysfunction of cervical region: Secondary | ICD-10-CM | POA: Diagnosis not present

## 2021-03-01 DIAGNOSIS — R252 Cramp and spasm: Secondary | ICD-10-CM | POA: Insufficient documentation

## 2021-03-01 DIAGNOSIS — M9903 Segmental and somatic dysfunction of lumbar region: Secondary | ICD-10-CM | POA: Insufficient documentation

## 2021-03-01 DIAGNOSIS — M542 Cervicalgia: Secondary | ICD-10-CM | POA: Insufficient documentation

## 2021-03-01 DIAGNOSIS — M9902 Segmental and somatic dysfunction of thoracic region: Secondary | ICD-10-CM | POA: Diagnosis not present

## 2021-03-01 DIAGNOSIS — G8929 Other chronic pain: Secondary | ICD-10-CM | POA: Diagnosis not present

## 2021-03-01 DIAGNOSIS — M9905 Segmental and somatic dysfunction of pelvic region: Secondary | ICD-10-CM | POA: Insufficient documentation

## 2021-03-01 NOTE — Patient Instructions (Signed)
Access Code: [email or web address] URL: [email or web address] Date: 03/01/2021 Prepared by: Claiborne Billings  Exercises Hooklying Single Knee to Chest - 3 x daily - 7 x weekly - 1 sets - 3 reps - 20 hold Sidelying Open Book Thoracic Rotation with Knee on Foam Roll - 3 x daily - 7 x weekly - 1 sets - 10 reps Seated Hamstring Stretch - 1 x daily - 7 x weekly - 3 sets - 10 reps Seated Figure 4 Piriformis Stretch - 1 x daily - 7 x weekly - 3 sets - 10 reps

## 2021-03-01 NOTE — Therapy (Signed)
[facility] [address] [place], [place], [address] Phone: [phone]   Fax:  [phone]  Physical Therapy Evaluation  Patient Details  Name: Dorothy [name] MRN: [id number] Date of Birth: 1955-12-16 Referring Provider (PT): Hulan Saas, PT   Encounter Date: 03/01/2021   PT End of Session - 03/01/21 1530     Visit Number 1    Date for PT Re-Evaluation 04/26/21    Authorization Type BC[employer]    PT Start Time 3818    PT Stop Time 1530    PT Time Calculation (min) 43 min    Activity Tolerance Patient tolerated treatment well    Behavior During Therapy [facility] for tasks assessed/performed             History reviewed. No pertinent past medical history.  Past Surgical History:  Procedure Laterality Date   CESAREAN SECTION  1994    There were no vitals filed for this visit.    Subjective Assessment - 03/01/21 1449     Subjective Pt presents to PT with complaints of thoracic and low back pain.    Diagnostic tests MRI: DDD lumbar spine, mild spinal canal stenosis and moderate bilateral neural foraminal    Patient Stated Goals reduce muscle pain    Currently in Pain? Yes    Pain Score --   1-3/10   Pain Location Back    Pain Orientation Left;Right;Lower    Pain Descriptors / Indicators Tightness;Pressure    Pain Type Chronic pain    Pain Onset More than a month ago    Pain Frequency Intermittent    Aggravating Factors  it is constant, Pure [place]re    Pain Relieving Factors massage                [facility] PT Assessment - 03/01/21 0001       Assessment   Medical Diagnosis chronic LBP pain    Referring Provider (PT) Hulan Saas, PT    Onset Date/Surgical Date --   chronic   Next MD Visit none scheduled    Prior Therapy none      Precautions   Precautions None      Balance Screen   Has the patient fallen in the past 6 months No    Has the patient had a decrease in activity level because of a fear  of falling?  No    Is the patient reluctant to leave their home because of a fear of falling?  No      Home Ecologist residence    Living Arrangements Spouse/significant other      Prior Function   Level of Independence Independent    Vocation Full time employment    Vocation Requirements travel, desk work, driving    Leisure [place], golf, walking      Cognition   Overall Cognitive Status Within Functional Limits for tasks assessed      Observation/Other Assessments   Focus on Therapeutic Outcomes (FOTO)  94 (goal is 92)      Posture/Postural Control   Posture/Postural Control No significant limitations      ROM / Strength   AROM / PROM / Strength AROM;Strength;PROM      AROM   Overall AROM  Within functional limits for tasks performed    Overall AROM Comments lumbar A/ROM is full with stiffness reported at end range      PROM   Overall PROM  Deficits  Overall PROM Comments limited by 25% in bil hips      Strength   Overall Strength Within functional limits for tasks performed      Palpation   Spinal mobility reduced segmental mobility in thoracic and lumbar spine    Palpation comment trigger points upper traps, suboccipitals, gluteals and thorcolumbar musculature      Transfers   Transfers Independent with all Transfers      Ambulation/Gait   Gait Pattern Within Functional Limits                        Objective measurements completed on examination: See above findings.       [facility] PT Treatment/Exercise - 03/01/21 0001       Manual Therapy   Manual Therapy Soft tissue mobilization;Myofascial release    Manual therapy comments skilled palpation and monitoring with dry needling              Trigger Point Dry Needling - 03/01/21 0001     Consent Given? Yes    Education Handout Provided Previously provided    Muscles Treated Head and Neck Upper trapezius    Muscles Treated Back/Hip Thoracic  multifidi    Upper Trapezius Response Twitch reponse elicited;Palpable increased muscle length    Thoracic multifidi response Twitch response elicited;Palpable increased muscle length                   PT Education - 03/01/21 1528     Education Details Access Code: [email or web address]    Person(s) Educated Patient    Methods Explanation;Demonstration;Handout    Comprehension Verbalized understanding;Returned demonstration              PT Short Term Goals - 03/01/21 1630       PT SHORT TERM GOAL #1   Title be independent in intial HEP    Time 4    Period Weeks    Status New    Target Date 03/29/21      PT SHORT TERM GOAL #2   Title report a 30% reduction in stiffness in the lumbar and thoracic spine    Time 4    Period Weeks    Status New    Target Date 03/29/21               PT Long Term Goals - 03/01/21 1633       PT LONG TERM GOAL #1   Title be independent in advanced HEP    Time 8    Period Weeks    Status New    Target Date 04/26/21      PT LONG TERM GOAL #2   Title report a 70% reduction in stiffness and tension in lumbar and thoracic spine    Time 8    Period Weeks    Status New    Target Date 04/26/21      PT LONG TERM GOAL #3   Title verbalize and demonstrated body mechanics modifications to reduce strain on spine    Time 8    Period Weeks    Status New    Target Date 04/26/21                    Plan - 03/01/21 1548     Clinical Impression Statement Patient is a 65 year old female presenting with LBP and upper back pain. MRI of the spine showed DDD in the lumbar region and mild spinal  canal stenosis and moderate bil neural foraminal stenosis.  Pt responds well to injections and had most recent last month.  Pt now reports tightness and soreness in the thoracic and lumbar spine and rates this pain as 1-3/10 and constant.  Pt is active and motivated and plays golf, does Barre and walks regularly.  Pt demonstrates full lumbar A/ROM  with stiffness reported at end range, reduced segmental mobility in the thoracic spine and hip flexibility limited by 25% bilaterally. Pt with diffuse palpable tension and trigger points in the lumbar spine, gluteals and thoracic paraspinals.  Pt will benefit from skilled PT to establish a regular stretching routine to supplement her current level of exercise, lumbopelvic strength and manual therapy to address tissue mobility and trigger points.    Personal Factors and Comorbidities Comorbidity 1    Comorbidities lumbar DDD    Examination-Activity Limitations Sit;Sleep    Examination-Participation Restrictions Other    Stability/Clinical Decision Making Stable/Uncomplicated    Clinical Decision Making Low    Rehab Potential Excellent    PT Frequency 2x / week    PT Duration 8 weeks    PT Treatment/Interventions ADLs/Self Care Home Management;Electrical Stimulation;Cryotherapy;Moist Heat;Therapeutic exercise;Therapeutic activities;Gait training;Neuromuscular re-education;Patient/family education;Manual techniques;Passive range of motion;Dry needling;Taping;Joint Manipulations;Spinal Manipulations    PT Next Visit Plan DN to thoracic, lumbar and gluteals as needed, review HEP and add    PT Home Exercise Plan Access Code: [email or web address]    Consulted and Agree with Plan of Care Patient             Patient will benefit from skilled therapeutic intervention in order to improve the following deficits and impairments:  Pain, Impaired flexibility, Increased muscle spasms, Hypomobility  Visit Diagnosis: Cramp and spasm - Plan: PT plan of care cert/re-cert  Chronic bilateral low back pain with sciatica, sciatica laterality unspecified - Plan: PT plan of care cert/re-cert  Pain in thoracic spine - Plan: PT plan of care cert/re-cert  Cervicalgia - Plan: PT plan of care cert/re-cert     Problem List Patient Active Problem List   Diagnosis Date Noted   Tendinitis of right triceps 04/25/2020    Cervicogenic headache 04/29/2018   Nonallopathic lesion of rib cage 02/26/2018   Achilles tendinosis of right lower extremity 09/10/2017   Trigger point of left shoulder region 04/11/2016   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 05/12/2015   Achilles tendinosis 03/14/2015   Trapezius muscle spasm 03/23/2014   Loss of transverse plantar arch 09/08/2013   Nonallopathic lesion of sacral region 07/20/2013   Greater trochanteric bursitis of right hip 06/24/2013   Piriformis syndrome of right side 05/25/2013   Nonallopathic lesion of cervical region 05/11/2013   Sacroiliac joint dysfunction of right side 04/27/2013   Leg length discrepancy 04/27/2013   Nonallopathic lesion of lumbosacral region 04/27/2013   Nonallopathic lesion of thoracic region 04/27/2013   BACK PAIN, LUMBAR 09/30/2009    Sigurd Sos, PT 03/01/21 4:38 PM   [facility] @ [address] [place], [place], [address] Phone: [phone]   Fax:  [phone]  Name: Dorothy [name] MRN: [id number] Date of Birth: 02/23/1956

## 2021-03-13 ENCOUNTER — Other Ambulatory Visit: Payer: Self-pay

## 2021-03-13 ENCOUNTER — Ambulatory Visit: Payer: BC Managed Care – PPO

## 2021-03-13 DIAGNOSIS — M546 Pain in thoracic spine: Secondary | ICD-10-CM

## 2021-03-13 DIAGNOSIS — G8929 Other chronic pain: Secondary | ICD-10-CM | POA: Diagnosis not present

## 2021-03-13 DIAGNOSIS — M5440 Lumbago with sciatica, unspecified side: Secondary | ICD-10-CM

## 2021-03-13 DIAGNOSIS — M542 Cervicalgia: Secondary | ICD-10-CM

## 2021-03-13 DIAGNOSIS — R252 Cramp and spasm: Secondary | ICD-10-CM | POA: Diagnosis not present

## 2021-03-13 DIAGNOSIS — M9905 Segmental and somatic dysfunction of pelvic region: Secondary | ICD-10-CM | POA: Diagnosis not present

## 2021-03-13 DIAGNOSIS — M9901 Segmental and somatic dysfunction of cervical region: Secondary | ICD-10-CM | POA: Diagnosis not present

## 2021-03-13 DIAGNOSIS — M5441 Lumbago with sciatica, right side: Secondary | ICD-10-CM

## 2021-03-13 DIAGNOSIS — M9908 Segmental and somatic dysfunction of rib cage: Secondary | ICD-10-CM | POA: Diagnosis not present

## 2021-03-13 DIAGNOSIS — M9903 Segmental and somatic dysfunction of lumbar region: Secondary | ICD-10-CM | POA: Diagnosis not present

## 2021-03-13 DIAGNOSIS — M9902 Segmental and somatic dysfunction of thoracic region: Secondary | ICD-10-CM | POA: Diagnosis not present

## 2021-03-13 NOTE — Therapy (Signed)
[facility] @ [place], [place], [address] Phone: [phone]   Fax:  [phone]  Physical Therapy Treatment  Patient Details  Name: Dorothy [name] MRN: [id number] Date of Birth: Apr 19, 1955 Referring Provider (PT): Hulan Saas, PT   Encounter Date: 03/13/2021   PT End of Session - 03/13/21 1607     Visit Number 2    Date for PT Re-Evaluation 04/26/21    Authorization Type BCBS    PT Start Time 1528    PT Stop Time 1604    PT Time Calculation (min) 36 min    Activity Tolerance Patient tolerated treatment well    Behavior During Therapy [facility] for tasks assessed/performed             History reviewed. No pertinent past medical history.  Past Surgical History:  Procedure Laterality Date   CESAREAN SECTION  1994    There were no vitals filed for this visit.   Subjective Assessment - 03/13/21 1530     Subjective I traveled and my hip is hurting.  [place]    Diagnostic tests MRI: DDD lumbar spine, mild spinal canal stenosis and moderate bilateral neural foraminal    Currently in Pain? Yes    Pain Score 6     Pain Location Back    Pain Orientation Right;Lower    Pain Descriptors / Indicators Tightness;Pressure    Pain Onset More than a month ago    Pain Frequency Intermittent    Aggravating Factors  doing a lot of walking, Pure [employer]    Pain Relieving Factors stretching                               [facility] Adult PT Treatment/Exercise - 03/13/21 0001       Posture/Postural Control   Posture/Postural Control No significant limitations      Manual Therapy   Manual Therapy Soft tissue mobilization;Myofascial release    Manual therapy comments skilled palpation and monitoring with dry needling    Soft tissue mobilization elongation and release to bil lumbar and gluteals              Trigger Point Dry Needling - 03/13/21 0001      Consent Given? Yes    Education Handout Provided Previously provided    Muscles Treated Back/Hip Lumbar multifidi;Erector spinae;Gluteus minimus;Gluteus medius    Gluteus Minimus Response Twitch response elicited;Palpable increased muscle length    Gluteus Medius Response Twitch response elicited;Palpable increased muscle length    Erector spinae Response Twitch response elicited;Palpable increased muscle length    Lumbar multifidi Response Twitch response elicited;Palpable increased muscle length                     PT Short Term Goals - 03/01/21 1630       PT SHORT TERM GOAL #1   Title be independent in intial HEP    Time 4    Period Weeks    Status New    Target Date 03/29/21      PT SHORT TERM GOAL #2   Title report a 30% reduction in stiffness in the lumbar and thoracic spine    Time 4    Period Weeks    Status New    Target Date 03/29/21  PT Long Term Goals - 03/01/21 1633       PT LONG TERM GOAL #1   Title be independent in advanced HEP    Time 8    Period Weeks    Status New    Target Date 04/26/21      PT LONG TERM GOAL #2   Title report a 70% reduction in stiffness and tension in lumbar and thoracic spine    Time 8    Period Weeks    Status New    Target Date 04/26/21      PT LONG TERM GOAL #3   Title verbalize and demonstrated body mechanics modifications to reduce strain on spine    Time 8    Period Weeks    Status New    Target Date 04/26/21                   Plan - 03/13/21 1609     Clinical Impression Statement First time follow-up after evaluation today.  Pt is independent in HEP for flexibility issued last session and remains active with playing golf and attending Barre classes.  Pt with tension in bil lumbar and associated musculature and had good twitch response to DN and improved tissue mobility after manual therapy today.  Pt will continue to benefit from skilled PT to address lumbar and thoracic pain.     PT Frequency 2x / week    PT Duration 8 weeks    PT Treatment/Interventions ADLs/Self Care Home Management;Electrical Stimulation;Cryotherapy;Moist Heat;Therapeutic exercise;Therapeutic activities;Gait training;Neuromuscular re-education;Patient/family education;Manual techniques;Passive range of motion;Dry needling;Taping;Joint Manipulations;Spinal Manipulations    PT Next Visit Plan DN to thoracic and upper traps,  review HEP and add postural strength    PT Home Exercise Plan Access Code: [email or web address]    Recommended Other Services initial cert is signed    Consulted and Agree with Plan of Care Patient             Patient will benefit from skilled therapeutic intervention in order to improve the following deficits and impairments:  Pain, Impaired flexibility, Increased muscle spasms, Hypomobility  Visit Diagnosis: Cramp and spasm  Chronic bilateral low back pain with sciatica, sciatica laterality unspecified  Pain in thoracic spine  Cervicalgia     Problem List Patient Active Problem List   Diagnosis Date Noted   Tendinitis of right triceps 04/25/2020   Cervicogenic headache 04/29/2018   Nonallopathic lesion of rib cage 02/26/2018   Achilles tendinosis of right lower extremity 09/10/2017   Trigger point of left shoulder region 04/11/2016   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 05/12/2015   Achilles tendinosis 03/14/2015   Trapezius muscle spasm 03/23/2014   Loss of transverse plantar arch 09/08/2013   Nonallopathic lesion of sacral region 07/20/2013   Greater trochanteric bursitis of right hip 06/24/2013   Piriformis syndrome of right side 05/25/2013   Nonallopathic lesion of cervical region 05/11/2013   Sacroiliac joint dysfunction of right side 04/27/2013   Leg length discrepancy 04/27/2013   Nonallopathic lesion of lumbosacral region 04/27/2013   Nonallopathic lesion of thoracic region 04/27/2013   BACK PAIN, LUMBAR 09/30/2009   Sigurd Sos,  PT 03/13/21 4:11 PM  [facility] @ [place], [place], [address] Phone: [phone]   Fax:  [phone]  Name: KELLI ROBECK MRN: [id number] Date of Birth: 1955/05/11

## 2021-03-16 ENCOUNTER — Other Ambulatory Visit: Payer: Self-pay

## 2021-03-16 ENCOUNTER — Ambulatory Visit: Payer: BC Managed Care – PPO | Attending: Sports Medicine

## 2021-03-16 DIAGNOSIS — M546 Pain in thoracic spine: Secondary | ICD-10-CM | POA: Insufficient documentation

## 2021-03-16 DIAGNOSIS — G8929 Other chronic pain: Secondary | ICD-10-CM | POA: Diagnosis not present

## 2021-03-16 DIAGNOSIS — M542 Cervicalgia: Secondary | ICD-10-CM | POA: Insufficient documentation

## 2021-03-16 DIAGNOSIS — R252 Cramp and spasm: Secondary | ICD-10-CM | POA: Insufficient documentation

## 2021-03-16 DIAGNOSIS — M5440 Lumbago with sciatica, unspecified side: Secondary | ICD-10-CM | POA: Insufficient documentation

## 2021-03-16 NOTE — Therapy (Signed)
[facility] @ [place], [place], [address] Phone: [phone]   Fax:  [phone]  Physical Therapy Treatment  Patient Details  Name: Dorothy [name] MRN: [id number] Date of Birth: [birth year] Referring Provider (PT): Hulan Saas, PT   Encounter Date: 03/16/2021   PT End of Session - 03/16/21 0807     Visit Number 3    Date for PT Re-Evaluation 04/26/21    Authorization Type BCBS    PT Start Time 0729    PT Stop Time 0806    PT Time Calculation (min) 37 min    Activity Tolerance Patient tolerated treatment well    Behavior During Therapy [facility] for tasks assessed/performed             History reviewed. No pertinent past medical history.  Past Surgical History:  Procedure Laterality Date   CESAREAN SECTION  1994    There were no vitals filed for this visit.   Subjective Assessment - 03/16/21 0731     Subjective I am still having hip pain in my hip.    Diagnostic tests MRI: DDD lumbar spine, mild spinal canal stenosis and moderate bilateral neural foraminal    Patient Stated Goals reduce muscle pain                               [facility] Adult PT Treatment/Exercise - 03/16/21 0001       Exercises   Exercises Shoulder      Shoulder Exercises: Standing   Horizontal ABduction Strengthening;Both;20 reps;Theraband    Theraband Level (Shoulder Horizontal ABduction) Level 2 (Red)    External Rotation Strengthening;Both;20 reps;Theraband    Theraband Level (Shoulder External Rotation) Level 2 (Red)    Diagonals Strengthening;20 reps;Theraband    Theraband Level (Shoulder Diagonals) Level 2 (Red)      Manual Therapy   Manual Therapy Soft tissue mobilization;Myofascial release    Manual therapy comments skilled palpation and monitoring with dry needling    Soft tissue mobilization elongation and release to bil throacic and upper traps              Trigger Point Dry Needling  - 03/16/21 0001     Consent Given? Yes    Education Handout Provided Previously provided    Muscles Treated Head and Neck Upper trapezius    Muscles Treated Upper Quadrant Rhomboids    Muscles Treated Back/Hip Thoracic multifidi    Upper Trapezius Response Twitch reponse elicited;Palpable increased muscle length    Rhomboids Response Twitch response elicited;Palpable increased muscle length    Thoracic multifidi response Twitch response elicited;Palpable increased muscle length                     PT Short Term Goals - 03/01/21 1630       PT SHORT TERM GOAL #1   Title be independent in intial HEP    Time 4    Period Weeks    Status New    Target Date 03/29/21      PT SHORT TERM GOAL #2   Title report a 30% reduction in stiffness in the lumbar and thoracic spine    Time 4    Period Weeks    Status New    Target Date 03/29/21               PT Long Term Goals - 03/01/21 1633  PT LONG TERM GOAL #1   Title be independent in advanced HEP    Time 8    Period Weeks    Status New    Target Date 04/26/21      PT LONG TERM GOAL #2   Title report a 70% reduction in stiffness and tension in lumbar and thoracic spine    Time 8    Period Weeks    Status New    Target Date 04/26/21      PT LONG TERM GOAL #3   Title verbalize and demonstrated body mechanics modifications to reduce strain on spine    Time 8    Period Weeks    Status New    Target Date 04/26/21                   Plan - 03/16/21 0806     Clinical Impression Statement Pt is doing well with exercises.  PT added thoracic strength to HEP and pt demonstrated well today.  PT with tension and trigger points in thoracic spine and upper traps today.  Good twitch response with DN and manual therapy and improved mobility after treatment.  Pt will continue to benefit from skilled PT to address tissue mobility, strength and flexibility.    PT Frequency 2x / week    PT Duration 8 weeks    PT  Treatment/Interventions ADLs/Self Care Home Management;Electrical Stimulation;Cryotherapy;Moist Heat;Therapeutic exercise;Therapeutic activities;Gait training;Neuromuscular re-education;Patient/family education;Manual techniques;Passive range of motion;Dry needling;Taping;Joint Manipulations;Spinal Manipulations    PT Next Visit Plan DN to lumbar and add QL.  Review HEP    PT Home Exercise Plan Access Code: [email or web address]    Consulted and Agree with Plan of Care Patient             Patient will benefit from skilled therapeutic intervention in order to improve the following deficits and impairments:  Pain, Impaired flexibility, Increased muscle spasms, Hypomobility  Visit Diagnosis: Cramp and spasm  Chronic bilateral low back pain with sciatica, sciatica laterality unspecified  Pain in thoracic spine  Cervicalgia     Problem List Patient Active Problem List   Diagnosis Date Noted   Tendinitis of right triceps 04/25/2020   Cervicogenic headache 04/29/2018   Nonallopathic lesion of rib cage 02/26/2018   Achilles tendinosis of right lower extremity 09/10/2017   Trigger point of left shoulder region 04/11/2016   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 05/12/2015   Achilles tendinosis 03/14/2015   Trapezius muscle spasm 03/23/2014   Loss of transverse plantar arch 09/08/2013   Nonallopathic lesion of sacral region 07/20/2013   Greater trochanteric bursitis of right hip 06/24/2013   Piriformis syndrome of right side 05/25/2013   Nonallopathic lesion of cervical region 05/11/2013   Sacroiliac joint dysfunction of right side 04/27/2013   Leg length discrepancy 04/27/2013   Nonallopathic lesion of lumbosacral region 04/27/2013   Nonallopathic lesion of thoracic region 04/27/2013   BACK PAIN, LUMBAR 09/30/2009    Sigurd Sos, PT 03/16/21 8:09 AM   [facility] @ [place], [place], [address] Phone:  [phone]   Fax:  [phone]  Name: Dorothy [name] MRN: [id number] Date of Birth: 12/25/1955

## 2021-03-23 ENCOUNTER — Other Ambulatory Visit: Payer: Self-pay

## 2021-03-23 ENCOUNTER — Ambulatory Visit: Payer: BC Managed Care – PPO

## 2021-03-23 DIAGNOSIS — M546 Pain in thoracic spine: Secondary | ICD-10-CM | POA: Diagnosis not present

## 2021-03-23 DIAGNOSIS — G8929 Other chronic pain: Secondary | ICD-10-CM

## 2021-03-23 DIAGNOSIS — R252 Cramp and spasm: Secondary | ICD-10-CM | POA: Diagnosis not present

## 2021-03-23 DIAGNOSIS — M5441 Lumbago with sciatica, right side: Secondary | ICD-10-CM

## 2021-03-23 DIAGNOSIS — M5440 Lumbago with sciatica, unspecified side: Secondary | ICD-10-CM | POA: Diagnosis not present

## 2021-03-23 DIAGNOSIS — M542 Cervicalgia: Secondary | ICD-10-CM

## 2021-03-23 NOTE — Therapy (Signed)
[facility] @ [place], [place], [address] Phone: [phone]   Fax:  [phone]  Physical Therapy Treatment  Patient Details  Name: Dorothy [name] MRN: [id number] Date of Birth: [birth year] Referring Provider (PT): Hulan Saas, PT   Encounter Date: 03/23/2021   PT End of Session - 03/23/21 0805     Visit Number 4    Date for PT Re-Evaluation 04/26/21    Authorization Type BCBS    PT Start Time 0730    PT Stop Time 0802    PT Time Calculation (min) 32 min    Activity Tolerance Patient tolerated treatment well    Behavior During Therapy [facility] for tasks assessed/performed             History reviewed. No pertinent past medical history.  Past Surgical History:  Procedure Laterality Date   CESAREAN SECTION  1994    There were no vitals filed for this visit.   Subjective Assessment - 03/23/21 0731     Subjective My Rt hip is killing me.    Diagnostic tests MRI: DDD lumbar spine, mild spinal canal stenosis and moderate bilateral neural foraminal    Patient Stated Goals reduce muscle pain    Currently in Pain? Yes    Pain Score 7     Pain Location Hip   buttocks   Pain Orientation Right;Lower    Pain Descriptors / Indicators Tightness;Pressure    Pain Onset More than a month ago    Pain Frequency Intermittent    Aggravating Factors  walking, activity    Pain Relieving Factors stretching                               [facility] Adult PT Treatment/Exercise - 03/23/21 0001       Manual Therapy   Manual Therapy Soft tissue mobilization;Myofascial release    Manual therapy comments skilled palpation and monitoring with dry needling    Soft tissue mobilization elongation and release to bil lumbar and gluteals Rt>Lt              Trigger Point Dry Needling - 03/23/21 0001     Consent Given? Yes    Muscles Treated Back/Hip Lumbar multifidi;Erector spinae;Gluteus  minimus;Gluteus medius    Gluteus Minimus Response Twitch response elicited;Palpable increased muscle length    Gluteus Medius Response Twitch response elicited;Palpable increased muscle length    Erector spinae Response Twitch response elicited;Palpable increased muscle length    Lumbar multifidi Response Twitch response elicited;Palpable increased muscle length                   PT Education - 03/23/21 0759     Education Details educated pt on change of position and flexion based activity to improve symptoms    Person(s) Educated Patient    Methods Explanation    Comprehension Verbalized understanding              PT Short Term Goals - 03/01/21 1630       PT SHORT TERM GOAL #1   Title be independent in intial HEP    Time 4    Period Weeks    Status New    Target Date 03/29/21      PT SHORT TERM GOAL #2   Title report a 30% reduction in stiffness in the lumbar and thoracic spine    Time 4  Period Weeks    Status New    Target Date 03/29/21               PT Long Term Goals - 03/01/21 1633       PT LONG TERM GOAL #1   Title be independent in advanced HEP    Time 8    Period Weeks    Status New    Target Date 04/26/21      PT LONG TERM GOAL #2   Title report a 70% reduction in stiffness and tension in lumbar and thoracic spine    Time 8    Period Weeks    Status New    Target Date 04/26/21      PT LONG TERM GOAL #3   Title verbalize and demonstrated body mechanics modifications to reduce strain on spine    Time 8    Period Weeks    Status New    Target Date 04/26/21                   Plan - 03/23/21 0805     Clinical Impression Statement Pt with increased pain in the the Rt gluteals and low back after travel this week.  Pain is 7/10 today and session focused on DN and manual to address tension in the lumbar spine and gluteals.  Pt with good response to DN and manual therapy with good twitch and reduced pain at the end of  session.  PT educated pt to change position frequently and do flexion based exercise to address spinal narrowing that is likely contributing to symptoms.  Pt continues with her HEP and barre classes.  Pt will continue to benefit from skilled PT to address LBP and thoracic/cervical pain.    PT Frequency 2x / week    PT Duration 8 weeks    PT Treatment/Interventions ADLs/Self Care Home Management;Electrical Stimulation;Cryotherapy;Moist Heat;Therapeutic exercise;Therapeutic activities;Gait training;Neuromuscular re-education;Patient/family education;Manual techniques;Passive range of motion;Dry needling;Taping;Joint Manipulations;Spinal Manipulations    PT Next Visit Plan DN to lumbar and add QL.  Review HEP, see how pt responded to DN today    PT Home Exercise Plan Access Code: [email or web address]    Consulted and Agree with Plan of Care Patient             Patient will benefit from skilled therapeutic intervention in order to improve the following deficits and impairments:  Pain, Impaired flexibility, Increased muscle spasms, Hypomobility  Visit Diagnosis: Cramp and spasm  Chronic bilateral low back pain with sciatica, sciatica laterality unspecified  Pain in thoracic spine  Cervicalgia     Problem List Patient Active Problem List   Diagnosis Date Noted   Tendinitis of right triceps 04/25/2020   Cervicogenic headache 04/29/2018   Nonallopathic lesion of rib cage 02/26/2018   Achilles tendinosis of right lower extremity 09/10/2017   Trigger point of left shoulder region 04/11/2016   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 05/12/2015   Achilles tendinosis 03/14/2015   Trapezius muscle spasm 03/23/2014   Loss of transverse plantar arch 09/08/2013   Nonallopathic lesion of sacral region 07/20/2013   Greater trochanteric bursitis of right hip 06/24/2013   Piriformis syndrome of right side 05/25/2013   Nonallopathic lesion of cervical region 05/11/2013   Sacroiliac joint  dysfunction of right side 04/27/2013   Leg length discrepancy 04/27/2013   Nonallopathic lesion of lumbosacral region 04/27/2013   Nonallopathic lesion of thoracic region 04/27/2013   BACK PAIN, LUMBAR 09/30/2009  Sigurd Sos, PT 03/23/21 8:06 AM  [facility] @ [place], [place], [address] Phone: [phone]   Fax:  [phone]  Name: Dorothy [name] MRN: [id number] Date of Birth: 04-May-1955

## 2021-03-26 ENCOUNTER — Encounter: Payer: Self-pay | Admitting: Family Medicine

## 2021-03-26 DIAGNOSIS — M5416 Radiculopathy, lumbar region: Secondary | ICD-10-CM

## 2021-03-28 ENCOUNTER — Ambulatory Visit
Admission: RE | Admit: 2021-03-28 | Discharge: 2021-03-28 | Disposition: A | Discharge status: Home / Self Care | Payer: BC Managed Care – PPO | Source: Ambulatory Visit | Attending: Family Medicine | Admitting: Family Medicine

## 2021-03-28 ENCOUNTER — Other Ambulatory Visit: Payer: Self-pay

## 2021-03-28 DIAGNOSIS — M4727 Other spondylosis with radiculopathy, lumbosacral region: Secondary | ICD-10-CM | POA: Diagnosis not present

## 2021-03-28 DIAGNOSIS — M5416 Radiculopathy, lumbar region: Secondary | ICD-10-CM

## 2021-03-28 MED ORDER — IOPAMIDOL (ISOVUE-M 300) INJECTION 61%
1.0000 mL | Freq: Once | INTRAMUSCULAR | Status: AC | PRN
  Administered 2021-03-28: 1 mL via EPIDURAL

## 2021-03-28 MED ORDER — METHYLPREDNISOLONE ACETATE 40 MG/ML INJ SUSP (RADIOLOG
80.0000 mg | Freq: Once | INTRAMUSCULAR | Status: AC
  Administered 2021-03-28: 80 mg via EPIDURAL

## 2021-03-28 NOTE — Discharge Instructions (Signed)

## 2021-03-30 ENCOUNTER — Ambulatory Visit: Payer: BC Managed Care – PPO

## 2021-04-18 ENCOUNTER — Other Ambulatory Visit: Payer: Self-pay

## 2021-04-18 ENCOUNTER — Ambulatory Visit: Payer: BC Managed Care – PPO | Attending: Sports Medicine

## 2021-04-18 DIAGNOSIS — G8929 Other chronic pain: Secondary | ICD-10-CM | POA: Insufficient documentation

## 2021-04-18 DIAGNOSIS — M546 Pain in thoracic spine: Secondary | ICD-10-CM | POA: Insufficient documentation

## 2021-04-18 DIAGNOSIS — R252 Cramp and spasm: Secondary | ICD-10-CM | POA: Insufficient documentation

## 2021-04-18 DIAGNOSIS — M5440 Lumbago with sciatica, unspecified side: Secondary | ICD-10-CM | POA: Diagnosis not present

## 2021-04-18 DIAGNOSIS — M542 Cervicalgia: Secondary | ICD-10-CM | POA: Diagnosis not present

## 2021-04-18 NOTE — Therapy (Addendum)
[facility] @ [place], [place], [address] Phone: [phone]   Fax:  [phone]  Physical Therapy Treatment  Patient Details  Name: Dorothy [name] MRN: [id number] Date of Birth: [birth year] Referring Provider (PT): Hulan Saas, PT   Encounter Date: 04/18/2021   PT End of Session - 04/18/21 0759     Visit Number 5    Date for PT Re-Evaluation 04/26/21    Authorization Type BCBS    PT Start Time 0732    PT Stop Time 0758    PT Time Calculation (min) 26 min    Activity Tolerance Patient tolerated treatment well    Behavior During Therapy [facility] for tasks assessed/performed             History reviewed. No pertinent past medical history.  Past Surgical History:  Procedure Laterality Date   CESAREAN SECTION  1994    There were no vitals filed for this visit.   Subjective Assessment - 04/18/21 0733     Subjective I have been traveling.  I got an epidural injection the week before Christmas.  It helped some.  I am tighter in my neck than anything today.    Currently in Pain? Yes    Pain Score 4     Pain Location Hip    Pain Orientation Right    Pain Descriptors / Indicators Tightness;Pressure    Pain Type Chronic pain    Pain Onset More than a month ago    Pain Frequency Intermittent    Aggravating Factors  walking, activity    Pain Relieving Factors stretching                [facility] PT Assessment - 04/18/21 0001       Assessment   Medical Diagnosis chronic LBP pain    Referring Provider (PT) Hulan Saas, PT      Observation/Other Assessments   Focus on Therapeutic Outcomes (FOTO)  94 (goal is 81)                           [facility] Adult PT Treatment/Exercise - 04/18/21 0001       Manual Therapy   Manual Therapy Soft tissue mobilization;Myofascial release    Manual therapy comments skilled palpation and monitoring with dry needling    Soft tissue mobilization  elongation and release bil neck and upper traps              Trigger Point Dry Needling - 04/18/21 0001     Consent Given? Yes    Education Handout Provided Previously provided    Muscles Treated Head and Neck Upper trapezius    Muscles Treated Upper Quadrant Rhomboids    Muscles Treated Back/Hip Thoracic multifidi    Upper Trapezius Response Twitch reponse elicited;Palpable increased muscle length    Rhomboids Response Twitch response elicited;Palpable increased muscle length    Thoracic multifidi response Twitch response elicited;Palpable increased muscle length                     PT Short Term Goals - 03/01/21 1630       PT SHORT TERM GOAL #1   Title be independent in intial HEP    Time 4    Period Weeks    Status New    Target Date 03/29/21      PT SHORT TERM GOAL #2   Title report a 30% reduction  in stiffness in the lumbar and thoracic spine    Time 4    Period Weeks    Status New    Target Date 03/29/21               PT Long Term Goals - 04/18/21 0739       PT LONG TERM GOAL #1   Title be independent in advanced HEP    Status Achieved      PT LONG TERM GOAL #2   Title report a 70% reduction in stiffness and tension in lumbar and thoracic spine    Baseline 25%    Status Partially Met      PT LONG TERM GOAL #3   Title verbalize and demonstrated body mechanics modifications to reduce strain on spine    Status Achieved                   Plan - 04/18/21 0758     Clinical Impression Statement Lapse in treatment since 03/23/21.  Pt had epidural injection 2 weeks ago with good response.  Pt has not been consistent with HEP during holiday travel and will resume this activity this week.   Pt with good response to DN and manual therapy with good twitch and reduced pain at the end of session.  Pt will be placed on hold until she sees MD.   Pt continues with her HEP and barre classes.    PT Frequency 2x / week    PT Duration 8 weeks     PT Treatment/Interventions ADLs/Self Care Home Management;Electrical Stimulation;Cryotherapy;Moist Heat;Therapeutic exercise;Therapeutic activities;Gait training;Neuromuscular re-education;Patient/family education;Manual techniques;Passive range of motion;Dry needling;Taping;Joint Manipulations;Spinal Manipulations    PT Next Visit Plan Hold chart until pt sees MD.  Probable D/C to HEP    PT Home Exercise Plan Access Code: [email or web address]    Consulted and Agree with Plan of Care Patient             Patient will benefit from skilled therapeutic intervention in order to improve the following deficits and impairments:  Pain, Impaired flexibility, Increased muscle spasms, Hypomobility  Visit Diagnosis: Cramp and spasm  Chronic bilateral low back pain with sciatica, sciatica laterality unspecified  Pain in thoracic spine  Cervicalgia     Problem List Patient Active Problem List   Diagnosis Date Noted   Tendinitis of right triceps 04/25/2020   Cervicogenic headache 04/29/2018   Nonallopathic lesion of rib cage 02/26/2018   Achilles tendinosis of right lower extremity 09/10/2017   Trigger point of left shoulder region 04/11/2016   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 05/12/2015   Achilles tendinosis 03/14/2015   Trapezius muscle spasm 03/23/2014   Loss of transverse plantar arch 09/08/2013   Nonallopathic lesion of sacral region 07/20/2013   Greater trochanteric bursitis of right hip 06/24/2013   Piriformis syndrome of right side 05/25/2013   Nonallopathic lesion of cervical region 05/11/2013   Sacroiliac joint dysfunction of right side 04/27/2013   Leg length discrepancy 04/27/2013   Nonallopathic lesion of lumbosacral region 04/27/2013   Nonallopathic lesion of thoracic region 04/27/2013   BACK PAIN, LUMBAR 09/30/2009    Sigurd Sos, PT 04/18/21 8:00 AM  PHYSICAL THERAPY DISCHARGE SUMMARY  Visits from Start of Care: 5  Current functional level related to  goals / functional outcomes: See above.  Pt returned to MD to discuss continued pain.    Remaining deficits: See above.  Continued pain that limits function.    Education / Equipment: HEP  Patient agrees to discharge. Patient goals were partially met. Patient is being discharged due to lack of progress.  Sigurd Sos, PT 06/05/21 2:18 PM   [facility] @ [place], [place], [address] Phone: [phone]   Fax:  [phone]  Name: ILINE BUCHINGER MRN: [id number] Date of Birth: [birth year]

## 2021-04-28 NOTE — Progress Notes (Signed)
Dorothy [name] Phone: [phone] Subjective:   Dorothy [name], am serving as a scribe for Dr. Hulan Saas. This visit occurred during the SARS-CoV-2 public health emergency.  Safety protocols were in place, including screening questions prior to the visit, additional usage of staff PPE, and extensive cleaning of exam room while observing appropriate contact time as indicated for disinfecting solutions.  I'm seeing this patient by the request  of:  Cari Caraway, MD  CC: Low back pain, neck pain  [id number]  Dorothy [name] is a 66 y.o. female coming in with complaint of back and neck pain. OMT on 02/21/2021. Patient states that she continues to have R hip pain. Last epidural 03/28/2021. Does not feel like this epidural injection did not help has much as before.   Medications patient has been prescribed: None          Reviewed prior external information including notes and imaging from previsou exam, outside providers and external EMR if available.   As well as notes that were available from care everywhere and other healthcare systems.  Past medical history, social, surgical and family history all reviewed in electronic medical record.  [name] pertanent information unless stated regarding to the chief complaint.   [name] past medical history on file.  [name] Known Allergies   Review of Systems:  [name] headache, visual changes, nausea, vomiting, diarrhea, constipation, dizziness, abdominal pain, skin rash, fevers, chills, night sweats, weight loss, swollen lymph nodes, body aches, joint swelling, chest pain, shortness of breath, mood changes. POSITIVE muscle aches  Objective  Blood pressure 122/82, pulse 69, height 5\' 1"  (1.549 m), last menstrual period 04/16/2008, SpO2 98 %.   General: [name] apparent distress alert and oriented x3 mood and affect normal, dressed appropriately.  HEENT: Pupils equal, extraocular  movements intact  Respiratory: Patient's speak in full sentences and does not appear short of breath  Cardiovascular: [name] lower extremity edema, non tender, [name] erythema  Low back exam does have some loss of lordosis.  Patient does have tightness noted on the right side of the paraspinal musculature.  Seems to be worse over the right sacroiliac joint, gluteal area as well.  Osteopathic findings  C2 flexed rotated and side bent right C6 flexed rotated and side bent left T3 extended rotated and side bent right inhaled rib T9 extended rotated and side bent left L2 flexed rotated and side bent right Sacrum right on right       Assessment and Plan:  Sacroiliac joint dysfunction of right side Continues to have significant pain at this time.  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  Patient has had III nerve root injections and each time has gotten better but does not seem to be lasting quite as long.  Do feel the possibility of medial branch block and possible radiofrequency ablation we can consider.  Patient wants to continue with conservative therapy at this time and see how she responds.  Wants to avoid significant number of medications.  Follow-up again in 6 weeks   Nonallopathic problems  Decision today to treat with OMT was based on Physical Exam  After verbal consent patient was treated with HVLA, ME, FPR techniques in cervical, rib, thoracic, lumbar, and sacral  areas  Patient tolerated the procedure well with improvement in symptoms  Patient given exercises, stretches and lifestyle modifications  See medications in patient  instructions if given  Patient will follow up in 4-8 weeks      The above documentation has been reviewed and is accurate and complete Dorothy Pulley, DO        Note: This dictation was prepared with Dragon dictation along with smaller phrase technology. Any transcriptional errors that  result from this process are unintentional.

## 2021-05-01 ENCOUNTER — Encounter: Payer: Self-pay | Admitting: Family Medicine

## 2021-05-01 ENCOUNTER — Ambulatory Visit: Payer: BC Managed Care – PPO | Admitting: Family Medicine

## 2021-05-01 ENCOUNTER — Other Ambulatory Visit: Payer: Self-pay

## 2021-05-01 VITALS — BP 122/82 | HR 69 | Ht 61.0 in

## 2021-05-01 DIAGNOSIS — M9903 Segmental and somatic dysfunction of lumbar region: Secondary | ICD-10-CM | POA: Diagnosis not present

## 2021-05-01 DIAGNOSIS — M9901 Segmental and somatic dysfunction of cervical region: Secondary | ICD-10-CM | POA: Diagnosis not present

## 2021-05-01 DIAGNOSIS — M9908 Segmental and somatic dysfunction of rib cage: Secondary | ICD-10-CM | POA: Diagnosis not present

## 2021-05-01 DIAGNOSIS — M9902 Segmental and somatic dysfunction of thoracic region: Secondary | ICD-10-CM | POA: Diagnosis not present

## 2021-05-01 DIAGNOSIS — M533 Sacrococcygeal disorders, not elsewhere classified: Secondary | ICD-10-CM

## 2021-05-01 DIAGNOSIS — M9904 Segmental and somatic dysfunction of sacral region: Secondary | ICD-10-CM | POA: Diagnosis not present

## 2021-05-01 NOTE — Patient Instructions (Signed)
Great to see you  Lets see how you do and then consider a medial branch block if it does not make a large change.  See me again in 5-6 weeks

## 2021-05-01 NOTE — Assessment & Plan Note (Signed)
Continues to have significant pain at this time.  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  Patient has had III nerve root injections and each time has gotten better but does not seem to be lasting quite as long.  Do feel the possibility of medial branch block and possible radiofrequency ablation we can consider.  Patient wants to continue with conservative therapy at this time and see how she responds.  Wants to avoid significant number of medications.  Follow-up again in 6 weeks

## 2021-05-14 ENCOUNTER — Encounter: Payer: Self-pay | Admitting: Family Medicine

## 2021-05-15 ENCOUNTER — Other Ambulatory Visit: Payer: Self-pay

## 2021-05-15 ENCOUNTER — Other Ambulatory Visit: Payer: Self-pay | Admitting: Family Medicine

## 2021-05-15 DIAGNOSIS — M47816 Spondylosis without myelopathy or radiculopathy, lumbar region: Secondary | ICD-10-CM

## 2021-05-19 ENCOUNTER — Other Ambulatory Visit: Payer: Self-pay | Admitting: Family Medicine

## 2021-05-19 ENCOUNTER — Ambulatory Visit
Admission: RE | Admit: 2021-05-19 | Discharge: 2021-05-19 | Disposition: A | Discharge status: Home / Self Care | Payer: BC Managed Care – PPO | Source: Ambulatory Visit | Attending: Family Medicine | Admitting: Family Medicine

## 2021-05-19 ENCOUNTER — Other Ambulatory Visit: Payer: Self-pay

## 2021-05-19 DIAGNOSIS — M4727 Other spondylosis with radiculopathy, lumbosacral region: Secondary | ICD-10-CM | POA: Diagnosis not present

## 2021-05-19 DIAGNOSIS — M47816 Spondylosis without myelopathy or radiculopathy, lumbar region: Secondary | ICD-10-CM

## 2021-05-19 NOTE — Discharge Instructions (Signed)

## 2021-05-24 ENCOUNTER — Other Ambulatory Visit: Payer: Self-pay | Admitting: Family Medicine

## 2021-05-24 ENCOUNTER — Other Ambulatory Visit: Payer: Self-pay

## 2021-05-24 ENCOUNTER — Telehealth: Payer: Self-pay | Admitting: Family Medicine

## 2021-05-24 DIAGNOSIS — M5459 Other low back pain: Secondary | ICD-10-CM

## 2021-05-24 NOTE — Telephone Encounter (Signed)
Per a verbal from Dr. Tamala Julian, medial branch block at L4/L5 ordered

## 2021-05-24 NOTE — Telephone Encounter (Signed)
Per [facility] will not approve RFA for patient until she has another Contractor.  Please order Medical Branch Block for pt.  FYI [employer] also requires PT for RFA's, which patient has completed.

## 2021-05-29 ENCOUNTER — Other Ambulatory Visit: Payer: BC Managed Care – PPO

## 2021-05-29 ENCOUNTER — Other Ambulatory Visit: Payer: Self-pay | Admitting: Family Medicine

## 2021-05-29 ENCOUNTER — Ambulatory Visit
Admission: RE | Admit: 2021-05-29 | Discharge: 2021-05-29 | Disposition: A | Discharge status: Home / Self Care | Payer: BC Managed Care – PPO | Source: Ambulatory Visit | Attending: Family Medicine | Admitting: Family Medicine

## 2021-05-29 ENCOUNTER — Other Ambulatory Visit: Payer: Self-pay

## 2021-05-29 DIAGNOSIS — M5459 Other low back pain: Secondary | ICD-10-CM

## 2021-05-29 DIAGNOSIS — M4727 Other spondylosis with radiculopathy, lumbosacral region: Secondary | ICD-10-CM | POA: Diagnosis not present

## 2021-05-29 DIAGNOSIS — M4316 Spondylolisthesis, lumbar region: Secondary | ICD-10-CM | POA: Diagnosis not present

## 2021-05-29 NOTE — Discharge Instructions (Signed)

## 2021-06-06 ENCOUNTER — Encounter: Payer: Self-pay | Admitting: Family Medicine

## 2021-06-06 NOTE — Progress Notes (Deleted)
°  [place] Phone: [phone] Subjective:    I'm seeing this patient by the request  of:  Cari Caraway, MD  CC:   [id number]  Dorothy [name] is a 66 y.o. female coming in with complaint of back and neck pain. OMT 05/01/2021. Patient states   Medications patient has been prescribed: None  Taking:         Reviewed prior external information including notes and imaging from previsou exam, outside providers and external EMR if available.   As well as notes that were available from care everywhere and other healthcare systems.  Past medical history, social, surgical and family history all reviewed in electronic medical record.  No pertanent information unless stated regarding to the chief complaint.   No past medical history on file.  No Known Allergies   Review of Systems:  No headache, visual changes, nausea, vomiting, diarrhea, constipation, dizziness, abdominal pain, skin rash, fevers, chills, night sweats, weight loss, swollen lymph nodes, body aches, joint swelling, chest pain, shortness of breath, mood changes. POSITIVE muscle aches  Objective  Last menstrual period 04/16/2008.   General: No apparent distress alert and oriented x3 mood and affect normal, dressed appropriately.  HEENT: Pupils equal, extraocular movements intact  Respiratory: Patient's speak in full sentences and does not appear short of breath  Cardiovascular: No lower extremity edema, non tender, no erythema  Neuro: Cranial nerves II through XII are intact, neurovascularly intact in all extremities with 2+ DTRs and 2+ pulses.  Gait normal with good balance and coordination.  MSK:  Non tender with full range of motion and good stability and symmetric strength and tone of shoulders, elbows, wrist, hip, knee and ankles bilaterally.  Back - Normal skin, Spine with normal alignment and no deformity.  No tenderness to vertebral  process palpation.  Paraspinous muscles are not tender and without spasm.   Range of motion is full at neck and lumbar sacral regions  Osteopathic findings  C2 flexed rotated and side bent right C6 flexed rotated and side bent left T3 extended rotated and side bent right inhaled rib T9 extended rotated and side bent left L2 flexed rotated and side bent right Sacrum right on right       Assessment and Plan:    Nonallopathic problems  Decision today to treat with OMT was based on Physical Exam  After verbal consent patient was treated with HVLA, ME, FPR techniques in cervical, rib, thoracic, lumbar, and sacral  areas  Patient tolerated the procedure well with improvement in symptoms  Patient given exercises, stretches and lifestyle modifications  See medications in patient instructions if given  Patient will follow up in 4-8 weeks      The above documentation has been reviewed and is accurate and complete Jacqualin Combes       Note: This dictation was prepared with Dragon dictation along with smaller phrase technology. Any transcriptional errors that result from this process are unintentional.

## 2021-06-07 ENCOUNTER — Ambulatory Visit: Payer: BC Managed Care – PPO | Admitting: Family Medicine

## 2021-06-08 DIAGNOSIS — H2513 Age-related nuclear cataract, bilateral: Secondary | ICD-10-CM | POA: Diagnosis not present

## 2021-06-08 DIAGNOSIS — H52203 Unspecified astigmatism, bilateral: Secondary | ICD-10-CM | POA: Diagnosis not present

## 2021-06-08 DIAGNOSIS — H0100A Unspecified blepharitis right eye, upper and lower eyelids: Secondary | ICD-10-CM | POA: Diagnosis not present

## 2021-06-08 DIAGNOSIS — H0100B Unspecified blepharitis left eye, upper and lower eyelids: Secondary | ICD-10-CM | POA: Diagnosis not present

## 2021-06-12 ENCOUNTER — Other Ambulatory Visit: Payer: BC Managed Care – PPO

## 2021-06-12 ENCOUNTER — Ambulatory Visit
Admission: RE | Admit: 2021-06-12 | Discharge: 2021-06-12 | Disposition: A | Discharge status: Home / Self Care | Payer: BC Managed Care – PPO | Source: Ambulatory Visit | Attending: Family Medicine | Admitting: Family Medicine

## 2021-06-12 ENCOUNTER — Other Ambulatory Visit: Payer: Self-pay

## 2021-06-12 ENCOUNTER — Other Ambulatory Visit: Payer: Self-pay | Admitting: Family Medicine

## 2021-06-12 DIAGNOSIS — M47816 Spondylosis without myelopathy or radiculopathy, lumbar region: Secondary | ICD-10-CM

## 2021-06-12 DIAGNOSIS — M4727 Other spondylosis with radiculopathy, lumbosacral region: Secondary | ICD-10-CM | POA: Diagnosis not present

## 2021-06-12 MED ORDER — MIDAZOLAM HCL 2 MG/2ML IJ SOLN
1.0000 mg | INTRAMUSCULAR | Status: DC | PRN
  Administered 2021-06-12: 1 mg via INTRAVENOUS
  Administered 2021-06-12: 0.5 mg via INTRAVENOUS
  Administered 2021-06-12: 1 mg via INTRAVENOUS

## 2021-06-12 MED ORDER — METHYLPREDNISOLONE ACETATE 40 MG/ML INJ SUSP (RADIOLOG: 80.0000 mg | Freq: Once | INTRAMUSCULAR | Status: DC

## 2021-06-12 MED ORDER — ONDANSETRON HCL 4 MG/2ML IJ SOLN: 4.0000 mg | Freq: Once | INTRAMUSCULAR | Status: DC

## 2021-06-12 MED ORDER — ONDANSETRON HCL 4 MG/2ML IJ SOLN
4.0000 mg | Freq: Once | INTRAMUSCULAR | Status: AC
  Administered 2021-06-12: 4 mg via INTRAVENOUS

## 2021-06-12 MED ORDER — SODIUM CHLORIDE 0.9 % IV SOLN: INTRAVENOUS | Status: DC

## 2021-06-12 MED ORDER — KETOROLAC TROMETHAMINE 30 MG/ML IJ SOLN
30.0000 mg | Freq: Once | INTRAMUSCULAR | Status: AC
  Administered 2021-06-12: 30 mg via INTRAVENOUS

## 2021-06-12 MED ORDER — FENTANYL CITRATE PF 50 MCG/ML IJ SOSY
25.0000 ug | PREFILLED_SYRINGE | INTRAMUSCULAR | Status: DC | PRN
  Administered 2021-06-12: 50 ug via INTRAVENOUS

## 2021-06-12 NOTE — Discharge Instructions (Signed)
Radio Frequency Ablation Post Procedure Discharge Instructions ? ?May resume a regular diet and any medications that you routinely take (including pain medications). ?No driving day of procedure. ?Upon discharge go home and rest for at least 4 hours.  May use an ice pack as needed to injection sites on back. ?Remove bandades later, today. ? ? ? ?Please contact our office at [phone] for the following symptoms: ? ?Fever greater than 100 degrees ?Increased swelling, pain, or redness at injection site. ? ? ?Thank you for visiting [facility].  ?

## 2021-06-12 NOTE — Progress Notes (Signed)
Pt back in nursing recovery area. Pt awake and eating crackers. Pt follows commands, talks in complete sentences and has no complaints at this time. Pt will be monitored until discharged by Radiologist.

## 2021-06-19 DIAGNOSIS — Z1231 Encounter for screening mammogram for malignant neoplasm of breast: Secondary | ICD-10-CM | POA: Diagnosis not present

## 2021-07-06 NOTE — Progress Notes (Signed)
?Charlann Boxer D.O. ?[facility] ?[place] ?Phone: [phone] ?Subjective:   ?I, Vilma Meckel, am serving as a Education administrator for Dr. Hulan Saas. ?This visit occurred during the SARS-CoV-2 public health emergency.  Safety protocols were in place, including screening questions prior to the visit, additional usage of staff PPE, and extensive cleaning of exam room while observing appropriate contact time as indicated for disinfecting solutions.  ? ?I'm seeing this patient by the request  of:  Cari Caraway, MD ? ?CC: Back and neck pain follow-up ? ?[id number]  ?Dorothy [name] is a 66 y.o. female coming in with complaint of back and neck pain. OMT 05/01/2021. Patient states had an ablation patient did have this the end portion of February., not much improvement.  Now having more knee pain.  Patient states that maybe the back is a little better in that aspect but felt like some of the leg pain was supposed to be getting better after the ablation.  Patient does not feel like that has helped much of her leg pain.  States though that now it does seem a little bit different where patient is having pain that goes from the knee up to the hip. ? ?Medications patient has been prescribed: None ? ? ?  ? ? ? ? ?Reviewed prior external information including notes and imaging from previsou exam, outside providers and external EMR if available.  ? ?As well as notes that were available from care everywhere and other healthcare systems. ? ?Past medical history, social, surgical and family history all reviewed in electronic medical record.  No pertanent information unless stated regarding to the chief complaint.  ? ?No past medical history on file.  ?No Known Allergies ? ? ?Review of Systems: ? No headache, visual changes, nausea, vomiting, diarrhea, constipation, dizziness, abdominal pain, skin rash, fevers, chills, night sweats, weight loss, swollen lymph nodes, body aches, joint swelling,  chest pain, shortness of breath, mood changes. POSITIVE muscle aches ? ?Objective  ?Blood pressure 130/76, pulse 78, height '5\' 1"'$  (1.549 m), weight 120 lb (54.4 kg), last menstrual period 04/16/2008, SpO2 99 %. ?  ?General: No apparent distress alert and oriented x3 mood and affect normal, dressed appropriately.  ?HEENT: Pupils equal, extraocular movements intact  ?Respiratory: Patient's speak in full sentences and does not appear short of breath  ?Cardiovascular: No lower extremity edema, non tender, no erythema  ?Very mild antalgic gait noted. ?Right knee exam does have some very mild pain with McMurray's.  Full range of motion though noted.  Minimal maybe trace effusion noted of the patellofemoral joint.  Patient otherwise does not have any significant findings.  Still tightness with straight leg test.  Discomfort more in the gluteal area in the posterior aspect of the lower back around the right sacroiliac joint ? ?Osteopathic findings ? ?C2 flexed rotated and side bent right ?C6 flexed rotated and side bent left ?T3 extended rotated and side bent right inhaled rib ?T9 extended rotated and side bent left ?L2 flexed rotated and side bent right ?Sacrum right on right ? ?After informed written and verbal consent, patient was seated on exam table. Right knee was prepped with alcohol swab and utilizing anterolateral approach, patient's right knee space was injected with 4:1  marcaine 0.5%: Kenalog '40mg'$ /dL. Patient tolerated the procedure well without immediate complications. ? ? ?  ?Assessment and Plan: ? ?BACK PAIN, LUMBAR ?Patient is responding well to osteopathic manipulation but did not have significant improvement noted with the  radiofrequency ablation.  I do believe though that patient did have an improvement when I subjective pain in GU and exam on patient.  Seems to be less tender than previous exam.  We did discuss that we could potentially consider further evaluation of the pelvic area but if he is needed  patient is going to make improvement.  Follow-up with me again in 6 to 8 weeks. ? ?Right anterior knee pain ?Patient was having knee pain previously that we did this was more secondary to the lumbar radiculopathy.  The patient states more the pain was centered around the knee so we did try an injection today.  Patient tolerated the procedure well and felt like she was already feeling somewhat better on her way out of the office.  If continuing to have trouble we will consider further work-up of the knee.  Most likely differential will be more of a patellofemoral syndrome as well as degenerative meniscus area.  Patient has good range of motion of the knee.  Follow-up with me again in 6 to 8 weeks  ? ?Nonallopathic problems ? ?Decision today to treat with OMT was based on Physical Exam ? ?After verbal consent patient was treated with HVLA, ME, FPR techniques in cervical, rib, thoracic, lumbar, and sacral  areas ? ?Patient tolerated the procedure well with improvement in symptoms ? ?Patient given exercises, stretches and lifestyle modifications ? ?See medications in patient instructions if given ? ?Patient will follow up in 4-8 weeks ? ?  ? ? ?The above documentation has been reviewed and is accurate and complete [name], DO ? ? ? ?  ? ? Note: This dictation was prepared with Dragon dictation along with smaller phrase technology. Any transcriptional errors that result from this process are unintentional.    ?  ?  ? ?

## 2021-07-07 ENCOUNTER — Ambulatory Visit: Payer: BC Managed Care – PPO | Admitting: Family Medicine

## 2021-07-07 ENCOUNTER — Other Ambulatory Visit: Payer: Self-pay

## 2021-07-07 VITALS — BP 130/76 | HR 78 | Ht 61.0 in | Wt 120.0 lb

## 2021-07-07 DIAGNOSIS — M9908 Segmental and somatic dysfunction of rib cage: Secondary | ICD-10-CM

## 2021-07-07 DIAGNOSIS — M25561 Pain in right knee: Secondary | ICD-10-CM | POA: Diagnosis not present

## 2021-07-07 DIAGNOSIS — M9903 Segmental and somatic dysfunction of lumbar region: Secondary | ICD-10-CM

## 2021-07-07 DIAGNOSIS — M9902 Segmental and somatic dysfunction of thoracic region: Secondary | ICD-10-CM | POA: Diagnosis not present

## 2021-07-07 DIAGNOSIS — M545 Low back pain, unspecified: Secondary | ICD-10-CM

## 2021-07-07 DIAGNOSIS — M9904 Segmental and somatic dysfunction of sacral region: Secondary | ICD-10-CM | POA: Diagnosis not present

## 2021-07-07 DIAGNOSIS — M9901 Segmental and somatic dysfunction of cervical region: Secondary | ICD-10-CM | POA: Diagnosis not present

## 2021-07-07 DIAGNOSIS — G8929 Other chronic pain: Secondary | ICD-10-CM

## 2021-07-07 NOTE — Patient Instructions (Signed)
Injected knee today ?See me in 6 weeks ?

## 2021-07-07 NOTE — Assessment & Plan Note (Signed)
Patient is responding well to osteopathic manipulation but did not have significant improvement noted with the radiofrequency ablation.  I do believe though that patient did have an improvement when I subjective pain in GU and exam on patient.  Seems to be less tender than previous exam.  We did discuss that we could potentially consider further evaluation of the pelvic area but if he is needed patient is going to make improvement.  Follow-up with me again in 6 to 8 weeks. ?

## 2021-07-07 NOTE — Assessment & Plan Note (Signed)
Patient was having knee pain previously that we did this was more secondary to the lumbar radiculopathy.  The patient states more the pain was centered around the knee so we did try an injection today.  Patient tolerated the procedure well and felt like she was already feeling somewhat better on her way out of the office.  If continuing to have trouble we will consider further work-up of the knee.  Most likely differential will be more of a patellofemoral syndrome as well as degenerative meniscus area.  Patient has good range of motion of the knee.  Follow-up with me again in 6 to 8 weeks ?

## 2021-08-10 NOTE — Progress Notes (Signed)
?Charlann Boxer D.O. ?[facility] ?[place] ?Phone: [phone] ?Subjective:   ?I, Jacqualin Combes, am serving as a scribe for Dr. Hulan Saas. ? ?This visit occurred during the SARS-CoV-2 public health emergency.  Safety protocols were in place, including screening questions prior to the visit, additional usage of staff PPE, and extensive cleaning of exam room while observing appropriate contact time as indicated for disinfecting solutions.  ? ? ?I'm seeing this patient by the request  of:  Cari Caraway, MD ? ?CC: back and neck pain  ? ?[id number]  ?NAILEAH KARG is a 66 y.o. female coming in with complaint of back and neck pain. OMT 07/07/2021. Patient states that the ablation did not help. Pain radiates down back of R leg. Pain worse at night but is present all day. Using IBU for pain. Did feel like cortisone in December helped the most.  ? ?Medications patient has been prescribed: None ? ? ? ? ?  ? ? ? ? ?Reviewed prior external information including notes and imaging from previsou exam, outside providers and external EMR if available.  ? ?As well as notes that were available from care everywhere and other healthcare systems. ? ?Past medical history, social, surgical and family history all reviewed in electronic medical record.  No pertanent information unless stated regarding to the chief complaint.  ? ?No past medical history on file.  ?No Known Allergies ? ? ?Review of Systems: ? No headache, visual changes, nausea, vomiting, diarrhea, constipation, dizziness, abdominal pain, skin rash, fevers, chills, night sweats, weight loss, swollen lymph nodes, body aches, joint swelling, chest pain, shortness of breath, mood changes. POSITIVE muscle aches ? ?Objective  ?Blood pressure 122/88, pulse 81, height '5\' 1"'$  (1.549 m), weight 116 lb (52.6 kg), last menstrual period 04/16/2008, SpO2 98 %. ?  ?General: No apparent distress alert and oriented x3 mood and affect normal,  dressed appropriately.  ?HEENT: Pupils equal, extraocular movements intact  ?Respiratory: Patient's speak in full sentences and does not appear short of breath  ?Cardiovascular: No lower extremity edema, non tender, no erythema  ?Low back exam ttp over the right tightness with SLT patient does have 5-5 strength of the lower extremity.  Patient is having more pain in the gluteal area and over the piriformis than usual. ?Right knee exam does have some mild tenderness over the patellofemoral joint but nothing severe. ? ?Osteopathic findings ? ?C2 flexed rotated and side bent right ?T3 extended rotated and side bent right inhaled rib ?T6 extended rotated and side bent left ?L2 flexed rotated and side bent right ?L4 flexed rotated and side bent right ?Sacrum right on right ? ? ? ? ?  ?Assessment and Plan: ? ?Sacroiliac joint dysfunction of right side ?Chronic problem with exacerbation.  Continuing to have more pain going down the right leg.  Did not respond well to the radiofrequency ablation.  We will try the nerve root injection to see if this will help again.  We discussed the possibility of a piriformis injection again which patient does not think has helped significantly in the past.  Continue with the osteopathic manipulation and given meloxicam for breakthrough pain.  Follow-up with me again in 6 to 8 weeks  ? ?Nonallopathic problems ? ?Decision today to treat with OMT was based on Physical Exam ? ?After verbal consent patient was treated with HVLA, ME, FPR techniques in cervical, rib, thoracic, lumbar, and sacral  areas ? ?Patient tolerated the procedure well with improvement  in symptoms ? ?Patient given exercises, stretches and lifestyle modifications ? ?See medications in patient instructions if given ? ?Patient will follow up in 4-8 weeks ? ?  ?The above documentation has been reviewed and is accurate and complete [name], DO ? ? ? ?  ? ? Note: This dictation was prepared with Dragon dictation along  with smaller phrase technology. Any transcriptional errors that result from this process are unintentional.    ?  ?  ? ?

## 2021-08-21 ENCOUNTER — Ambulatory Visit: Payer: Medicare HMO | Admitting: Family Medicine

## 2021-08-21 VITALS — BP 122/88 | HR 81 | Ht 61.0 in | Wt 116.0 lb

## 2021-08-21 DIAGNOSIS — M9902 Segmental and somatic dysfunction of thoracic region: Secondary | ICD-10-CM

## 2021-08-21 DIAGNOSIS — M9908 Segmental and somatic dysfunction of rib cage: Secondary | ICD-10-CM

## 2021-08-21 DIAGNOSIS — M5416 Radiculopathy, lumbar region: Secondary | ICD-10-CM | POA: Diagnosis not present

## 2021-08-21 DIAGNOSIS — M9903 Segmental and somatic dysfunction of lumbar region: Secondary | ICD-10-CM

## 2021-08-21 DIAGNOSIS — M533 Sacrococcygeal disorders, not elsewhere classified: Secondary | ICD-10-CM | POA: Diagnosis not present

## 2021-08-21 DIAGNOSIS — M9901 Segmental and somatic dysfunction of cervical region: Secondary | ICD-10-CM

## 2021-08-21 DIAGNOSIS — M9904 Segmental and somatic dysfunction of sacral region: Secondary | ICD-10-CM

## 2021-08-21 MED ORDER — MELOXICAM 15 MG PO TABS: 15.0000 mg | ORAL_TABLET | Freq: Every day | ORAL | 0 refills | Status: DC

## 2021-08-21 NOTE — Patient Instructions (Addendum)
Meloxicam '15mg'$  take in 5 days bursts ?Stay active ?Do not use NSAIDS such as Advil or Aleve when taking Meloxicam ?It is ok to use Tylenol for additional pain relief ?Ice after activity ?Don't steal money with bridge ?[phone] for epidural ?See me in 6-8 weeks ?

## 2021-08-21 NOTE — Assessment & Plan Note (Signed)
Chronic problem with exacerbation.  Continuing to have more pain going down the right leg.  Did not respond well to the radiofrequency ablation.  We will try the nerve root injection to see if this will help again.  We discussed the possibility of a piriformis injection again which patient does not think has helped significantly in the past.  Continue with the osteopathic manipulation and given meloxicam for breakthrough pain.  Follow-up with me again in 6 to 8 weeks ?

## 2021-09-12 ENCOUNTER — Other Ambulatory Visit: Payer: Self-pay | Admitting: Family Medicine

## 2021-10-02 ENCOUNTER — Ambulatory Visit
Admission: RE | Admit: 2021-10-02 | Discharge: 2021-10-02 | Disposition: A | Discharge status: Home / Self Care | Payer: Medicare HMO | Source: Ambulatory Visit | Attending: Family Medicine | Admitting: Family Medicine

## 2021-10-02 DIAGNOSIS — M4727 Other spondylosis with radiculopathy, lumbosacral region: Secondary | ICD-10-CM | POA: Diagnosis not present

## 2021-10-02 DIAGNOSIS — M5416 Radiculopathy, lumbar region: Secondary | ICD-10-CM

## 2021-10-02 MED ORDER — METHYLPREDNISOLONE ACETATE 40 MG/ML INJ SUSP (RADIOLOG
80.0000 mg | Freq: Once | INTRAMUSCULAR | Status: AC
  Administered 2021-10-02: 80 mg via EPIDURAL

## 2021-10-02 MED ORDER — IOPAMIDOL (ISOVUE-M 200) INJECTION 41%
1.0000 mL | Freq: Once | INTRAMUSCULAR | Status: AC
  Administered 2021-10-02: 1 mL via EPIDURAL

## 2021-10-02 NOTE — Discharge Instructions (Signed)

## 2021-10-10 NOTE — Progress Notes (Unsigned)
  [place] Phone: [phone] Subjective:   [name], am serving as a scribe for Dr. Hulan Saas.   I'm seeing this patient by the request  of:  Cari Caraway, MD  CC: Back pain and neck pain follow-up  [id number]  Dorothy [name] is a 66 y.o. female coming in with complaint of back and neck pain. OMT 08/21/2021. Patient states that she had injection last Monday which helped to alleviate her pain. Still complaining of knee pain but epidural did help her pain somewhat.  Patient did have an injection in the knee previously that did not make any significant improvement.  Medications patient has been prescribed: Meloxicam  Taking: Yes that is helpful         Reviewed prior external information including notes and imaging from previsou exam, outside providers and external EMR if available.   As well as notes that were available from care everywhere and other healthcare systems.  Past medical history, social, surgical and family history all reviewed in electronic medical record.  No pertanent information unless stated regarding to the chief complaint.   No past medical history on file.  No Known Allergies   Review of Systems:  No headache, visual changes, nausea, vomiting, diarrhea, constipation, dizziness, abdominal pain, skin rash, fevers, chills, night sweats, weight loss, swollen lymph nodes, joint swelling, chest pain, shortness of breath, mood changes. POSITIVE muscle aches, body aches  Objective  Blood pressure 124/84, pulse 62, height '5\' 1"'$  (1.549 m), weight 114 lb (51.7 kg), last menstrual period 04/16/2008, SpO2 99 %.   General: No apparent distress alert and oriented x3 mood and affect normal, dressed appropriately.  HEENT: Pupils equal, extraocular movements intact  Respiratory: Patient's speak in full sentences and does not appear short of breath  Cardiovascular: No lower extremity  edema, non tender, no erythema  Gait MSK:  Back low back exam still has some limited extension and flexion of the back.  Patient does have tightness noted with sidebending bilaterally.  Osteopathic findings  C2 flexed rotated and side bent right C5 flexed rotated and side bent left T3 extended rotated and side bent left inhaled rib T6 extended rotated and side bent left L2 flexed rotated and side bent right L4 flexed rotated and side bent right Sacrum right on right       Assessment and Plan:  No problem-specific Assessment & Plan notes found for this encounter.    Nonallopathic problems  Decision today to treat with OMT was based on Physical Exam  After verbal consent patient was treated with HVLA, ME, FPR techniques in cervical, rib, thoracic, lumbar, and sacral  areas  Patient tolerated the procedure well with improvement in symptoms  Patient given exercises, stretches and lifestyle modifications  See medications in patient instructions if given  Patient will follow up in 4-8 weeks    The above documentation has been reviewed and is accurate and complete [name], DO          Note: This dictation was prepared with Dragon dictation along with smaller phrase technology. Any transcriptional errors that result from this process are unintentional.

## 2021-10-11 ENCOUNTER — Ambulatory Visit: Payer: Medicare HMO | Admitting: Family Medicine

## 2021-10-11 ENCOUNTER — Encounter: Payer: Self-pay | Admitting: Family Medicine

## 2021-10-11 ENCOUNTER — Ambulatory Visit (INDEPENDENT_AMBULATORY_CARE_PROVIDER_SITE_OTHER): Payer: Medicare HMO

## 2021-10-11 VITALS — BP 124/84 | HR 62 | Ht 61.0 in | Wt 114.0 lb

## 2021-10-11 DIAGNOSIS — M533 Sacrococcygeal disorders, not elsewhere classified: Secondary | ICD-10-CM | POA: Diagnosis not present

## 2021-10-11 DIAGNOSIS — M9904 Segmental and somatic dysfunction of sacral region: Secondary | ICD-10-CM

## 2021-10-11 DIAGNOSIS — M9908 Segmental and somatic dysfunction of rib cage: Secondary | ICD-10-CM | POA: Diagnosis not present

## 2021-10-11 DIAGNOSIS — M25561 Pain in right knee: Secondary | ICD-10-CM

## 2021-10-11 DIAGNOSIS — M9903 Segmental and somatic dysfunction of lumbar region: Secondary | ICD-10-CM

## 2021-10-11 DIAGNOSIS — M9902 Segmental and somatic dysfunction of thoracic region: Secondary | ICD-10-CM

## 2021-10-11 DIAGNOSIS — M9901 Segmental and somatic dysfunction of cervical region: Secondary | ICD-10-CM | POA: Diagnosis not present

## 2021-10-11 NOTE — Patient Instructions (Addendum)
Good to see you Hip abductor exercises Enjoy time in [place] See [place] in 6 weeks

## 2021-10-12 ENCOUNTER — Encounter: Payer: Self-pay | Admitting: Family Medicine

## 2021-10-12 NOTE — Assessment & Plan Note (Signed)
Continues to have difficulty but does have radiation of the pain as well.  Has responded to the epidurals previously and I think it will continue to be helpful.  We discussed potential x-rays of the knee to make sure anything else would be not contributing.  Tried piriformis injections previously as well.  Discussed the possible need for imaging but I think we will be okay at the moment.  Follow-up with me again in 6 to 8 weeks.

## 2021-10-13 DIAGNOSIS — Z23 Encounter for immunization: Secondary | ICD-10-CM | POA: Diagnosis not present

## 2021-10-13 DIAGNOSIS — Z124 Encounter for screening for malignant neoplasm of cervix: Secondary | ICD-10-CM | POA: Diagnosis not present

## 2021-10-13 DIAGNOSIS — Z13220 Encounter for screening for lipoid disorders: Secondary | ICD-10-CM | POA: Diagnosis not present

## 2021-10-13 DIAGNOSIS — Z1331 Encounter for screening for depression: Secondary | ICD-10-CM | POA: Diagnosis not present

## 2021-10-13 DIAGNOSIS — M8588 Other specified disorders of bone density and structure, other site: Secondary | ICD-10-CM | POA: Diagnosis not present

## 2021-10-13 DIAGNOSIS — Z9189 Other specified personal risk factors, not elsewhere classified: Secondary | ICD-10-CM | POA: Diagnosis not present

## 2021-10-13 DIAGNOSIS — Z131 Encounter for screening for diabetes mellitus: Secondary | ICD-10-CM | POA: Diagnosis not present

## 2021-10-13 DIAGNOSIS — Z136 Encounter for screening for cardiovascular disorders: Secondary | ICD-10-CM | POA: Diagnosis not present

## 2021-10-13 DIAGNOSIS — J309 Allergic rhinitis, unspecified: Secondary | ICD-10-CM | POA: Diagnosis not present

## 2021-10-13 DIAGNOSIS — Z0000 Encounter for general adult medical examination without abnormal findings: Secondary | ICD-10-CM | POA: Diagnosis not present

## 2021-10-24 DIAGNOSIS — L814 Other melanin hyperpigmentation: Secondary | ICD-10-CM | POA: Diagnosis not present

## 2021-10-24 DIAGNOSIS — Z780 Asymptomatic menopausal state: Secondary | ICD-10-CM | POA: Diagnosis not present

## 2021-10-24 DIAGNOSIS — D1801 Hemangioma of skin and subcutaneous tissue: Secondary | ICD-10-CM | POA: Diagnosis not present

## 2021-10-24 DIAGNOSIS — D225 Melanocytic nevi of trunk: Secondary | ICD-10-CM | POA: Diagnosis not present

## 2021-10-24 DIAGNOSIS — L821 Other seborrheic keratosis: Secondary | ICD-10-CM | POA: Diagnosis not present

## 2021-10-24 DIAGNOSIS — M8589 Other specified disorders of bone density and structure, multiple sites: Secondary | ICD-10-CM | POA: Diagnosis not present

## 2021-10-24 DIAGNOSIS — D2272 Melanocytic nevi of left lower limb, including hip: Secondary | ICD-10-CM | POA: Diagnosis not present

## 2021-11-24 NOTE — Progress Notes (Unsigned)
Dorothy Dorothy [name] Phone: [phone] Subjective:   Dorothy Dorothy [name], am serving as a scribe for Dr. Hulan Saas.  I'm seeing this patient by the request  of:  Dorothy Caraway, MD  CC: Neck and back pain follow-up  [id number]  Dorothy Dorothy [name] is a 66 y.o. female coming in with complaint of back and neck pain. OMT 10/11/2021. Patient states that her R knee is doing much better. Epidural was helpful back on June 19th.  Patient states that she has been able to do more activities.  Playing pickle ball, golf and doing working out.  Has been doing much easier.  Not taking the ibuprofen as frequently.  Medications patient has been prescribed: Meloxicam  Taking:Very intermittent        Reviewed prior external information including notes and imaging from previsou exam, outside providers and external EMR if available.   As well as notes that were available from care everywhere and other healthcare systems.  Past medical history, social, surgical and family history all reviewed in electronic medical record.  Dorothy [name] pertanent information unless stated regarding to the chief complaint.   Dorothy [name] past medical history on file.  Dorothy [name] Known Allergies   Review of Systems:  Dorothy [name] headache, visual changes, nausea, vomiting, diarrhea, constipation, dizziness, abdominal pain, skin rash, fevers, chills, night sweats, weight loss, swollen lymph nodes, body aches, joint swelling, chest pain, shortness of breath, mood changes. POSITIVE muscle aches  Objective  Blood pressure 130/72, pulse (!) 57, height '5\' 1"'$  (1.549 m), weight 113 lb (51.3 kg), last menstrual period 04/16/2008, SpO2 98 %.   General: Dorothy [name] apparent distress alert and oriented x3 mood and affect normal, dressed appropriately.  HEENT: Pupils equal, extraocular movements intact  Respiratory: Patient's speak in full sentences and does not appear short of breath  Cardiovascular: Dorothy [name] lower  extremity edema, non tender, Dorothy [name] erythema  Gait MSK:  Back does have some loss of lordosis.  Some tenderness to palpation in the paraspinal musculature.  Patient does have tightness with Corky Sox right greater than left.  Osteopathic findings  C3 flexed rotated and side bent right C7 flexed rotated and side bent left T3 extended rotated and side bent right inhaled rib T9 extended rotated and side bent left L3 flexed rotated and side bent left L5 flexed rotated and side bent right Sacrum right on right       Assessment and Plan:  Sacroiliac joint dysfunction of right side  Chronic problem but mild exacerbation noted as well.  Has made some improvement with the epidural with some of the deep pain but continues to have difficulty with muscle imbalances.  Discussed the meloxicam or ibuprofen for pain relief when necessary but not both.  Discussed with patient about posture and ergonomics otherwise.  Continue to increase activity slowly.  Follow-up again in 6 to 8 weeks     Nonallopathic problems  Decision today to treat with OMT was based on Physical Exam  After verbal consent patient was treated with HVLA, ME, FPR techniques in cervical, rib, thoracic, lumbar, and sacral  areas  Patient tolerated the procedure well with improvement in symptoms  Patient given exercises, stretches and lifestyle modifications  See medications in patient instructions if given  Patient will follow up in 4-8 weeks             Note: This dictation was prepared with Dragon dictation along with smaller phrase technology. Any transcriptional errors that result from  this process are unintentional.

## 2021-11-27 ENCOUNTER — Ambulatory Visit: Payer: Medicare HMO | Admitting: Family Medicine

## 2021-11-27 VITALS — BP 130/72 | HR 57 | Ht 61.0 in | Wt 113.0 lb

## 2021-11-27 DIAGNOSIS — M9901 Segmental and somatic dysfunction of cervical region: Secondary | ICD-10-CM | POA: Diagnosis not present

## 2021-11-27 DIAGNOSIS — M9902 Segmental and somatic dysfunction of thoracic region: Secondary | ICD-10-CM | POA: Diagnosis not present

## 2021-11-27 DIAGNOSIS — M533 Sacrococcygeal disorders, not elsewhere classified: Secondary | ICD-10-CM | POA: Diagnosis not present

## 2021-11-27 DIAGNOSIS — M9904 Segmental and somatic dysfunction of sacral region: Secondary | ICD-10-CM

## 2021-11-27 DIAGNOSIS — M9908 Segmental and somatic dysfunction of rib cage: Secondary | ICD-10-CM

## 2021-11-27 DIAGNOSIS — M9903 Segmental and somatic dysfunction of lumbar region: Secondary | ICD-10-CM | POA: Diagnosis not present

## 2021-11-27 NOTE — Patient Instructions (Signed)
Beat up on your bridge friends See me in 7-8 weeks

## 2021-11-27 NOTE — Assessment & Plan Note (Signed)
  Chronic problem but mild exacerbation noted as well.  Has made some improvement with the epidural with some of the deep pain but continues to have difficulty with muscle imbalances.  Discussed the meloxicam or ibuprofen for pain relief when necessary but not both.  Discussed with patient about posture and ergonomics otherwise.  Continue to increase activity slowly.  Follow-up again in 6 to 8 weeks

## 2022-01-02 ENCOUNTER — Other Ambulatory Visit: Payer: Self-pay

## 2022-01-02 ENCOUNTER — Encounter: Payer: Self-pay | Admitting: Family Medicine

## 2022-01-02 DIAGNOSIS — M5416 Radiculopathy, lumbar region: Secondary | ICD-10-CM

## 2022-01-05 ENCOUNTER — Ambulatory Visit
Admission: RE | Admit: 2022-01-05 | Discharge: 2022-01-05 | Disposition: A | Discharge status: Home / Self Care | Payer: Medicare HMO | Source: Ambulatory Visit | Attending: Family Medicine | Admitting: Family Medicine

## 2022-01-05 ENCOUNTER — Other Ambulatory Visit: Payer: Medicare HMO

## 2022-01-05 DIAGNOSIS — M4727 Other spondylosis with radiculopathy, lumbosacral region: Secondary | ICD-10-CM | POA: Diagnosis not present

## 2022-01-05 DIAGNOSIS — M5416 Radiculopathy, lumbar region: Secondary | ICD-10-CM

## 2022-01-05 MED ORDER — IOPAMIDOL (ISOVUE-M 200) INJECTION 41%
1.0000 mL | Freq: Once | INTRAMUSCULAR | Status: AC
  Administered 2022-01-05: 1 mL via EPIDURAL

## 2022-01-05 MED ORDER — METHYLPREDNISOLONE ACETATE 40 MG/ML INJ SUSP (RADIOLOG
80.0000 mg | Freq: Once | INTRAMUSCULAR | Status: AC
  Administered 2022-01-05: 80 mg via EPIDURAL

## 2022-01-05 NOTE — Discharge Instructions (Signed)

## 2022-01-19 NOTE — Progress Notes (Signed)
[place] Phone: [phone] Subjective:   Dorothy Dorothy [name], am serving as a scribe for Dr. Hulan Saas.  I'm seeing this patient by the request  of:  Dorothy Caraway, MD  CC: Low back pain, neck pain follow-up  [id number]  Dorothy Dorothy [name] is a 66 y.o. female coming in with complaint of back and neck pain. OMT 11/27/2021. Also f/u for R knee pain. Patient states that she had epidural on 01/06/2022 and is doing well. R knee is also feeling fine. Does have some numbness in R leg when seated.  Patient states that it has gotten bad enough that she has also had to stop some of her golf at the end of a long round.  Patient denies though any numbness or tingling lasts all day at the moment and that has improved a little bit since the injection.          Reviewed prior external information including notes and imaging from previsou exam, outside providers and external EMR if available.   As well as notes that were available from care everywhere and other healthcare systems.  Past medical history, social, surgical and family history all reviewed in electronic medical record.  Dorothy [name] pertanent information unless stated regarding to the chief complaint.   Dorothy [name] past medical history on file.  Dorothy [name] Known Allergies   Review of Systems:  Dorothy [name] headache, visual changes, nausea, vomiting, diarrhea, constipation, dizziness, abdominal pain, skin rash, fevers, chills, night sweats, weight loss, swollen lymph nodes, body aches, joint swelling, chest pain, shortness of breath, mood changes. POSITIVE muscle aches  Objective  Pulse 67, height '5\' 1"'$  (1.549 m), last menstrual period 04/16/2008, SpO2 98 %.   General: Dorothy [name] apparent distress alert and oriented x3 mood and affect normal, dressed appropriately.  HEENT: Pupils equal, extraocular movements intact  Respiratory: Patient's speak in full sentences and does not appear short of breath   Cardiovascular: Dorothy [name] lower extremity edema, non tender, Dorothy [name] erythema  Gait MSK:  Back neck exam does have some loss of lordosis.  Tightness noted at the occipital region.  Some limited sidebending bilaterally.  Low back still has tightness noted on the right paraspinal musculature.  Does have tightness of the right sacroiliac joint also noted.  Osteopathic findings  C3 flexed rotated and side bent right C6 flexed rotated and side bent left T3 extended rotated and side bent right inhaled rib T8 extended rotated and side bent left L1 flexed rotated and side bent right Sacrum right on right       Assessment and Plan:  Cervicogenic headache Cervicogenic headaches.  Discussed icing regimen and home exercises, which activities to do and which ones to avoid.  Patient is doing relatively well with this. Unfortunately discomfort that we will need to continue to monitor.  Does have stress in her life as well as activities.  Follow-up again in 6 to 8 weeks  Sacroiliac joint dysfunction of right side Continue tightness on the right side.  He does have a nerve impingement on the right side of the lower back.  Discussed icing regimen and home exercises, patient can have the nerve root injections if necessary.  Follow-up with me again in 6 to 8 weeks    Nonallopathic problems  Decision today to treat with OMT was based on Physical Exam  After verbal consent patient was treated with HVLA, ME, FPR techniques in cervical, rib, thoracic, lumbar, and sacral  areas  Patient  tolerated the procedure well with improvement in symptoms  Patient given exercises, stretches and lifestyle modifications  See medications in patient instructions if given  Patient will follow up in 4-8 weeks    The above documentation has been reviewed and is accurate and complete Dorothy Pulley, DO          Note: This dictation was prepared with Dragon dictation along with smaller phrase technology. Any  transcriptional errors that result from this process are unintentional.

## 2022-01-22 ENCOUNTER — Encounter: Payer: Self-pay | Admitting: Family Medicine

## 2022-01-22 ENCOUNTER — Ambulatory Visit: Payer: Medicare HMO | Admitting: Family Medicine

## 2022-01-22 VITALS — HR 67 | Ht 61.0 in

## 2022-01-22 DIAGNOSIS — M9903 Segmental and somatic dysfunction of lumbar region: Secondary | ICD-10-CM

## 2022-01-22 DIAGNOSIS — M9904 Segmental and somatic dysfunction of sacral region: Secondary | ICD-10-CM | POA: Diagnosis not present

## 2022-01-22 DIAGNOSIS — M9908 Segmental and somatic dysfunction of rib cage: Secondary | ICD-10-CM

## 2022-01-22 DIAGNOSIS — M9901 Segmental and somatic dysfunction of cervical region: Secondary | ICD-10-CM | POA: Diagnosis not present

## 2022-01-22 DIAGNOSIS — G4486 Cervicogenic headache: Secondary | ICD-10-CM

## 2022-01-22 DIAGNOSIS — M9902 Segmental and somatic dysfunction of thoracic region: Secondary | ICD-10-CM | POA: Diagnosis not present

## 2022-01-22 DIAGNOSIS — M533 Sacrococcygeal disorders, not elsewhere classified: Secondary | ICD-10-CM

## 2022-01-22 NOTE — Patient Instructions (Signed)
Good to see you See me again in 6 weeks

## 2022-01-22 NOTE — Assessment & Plan Note (Signed)
Cervicogenic headaches.  Discussed icing regimen and home exercises, which activities to do and which ones to avoid.  Patient is doing relatively well with this. Unfortunately discomfort that we will need to continue to monitor.  Does have stress in her life as well as activities.  Follow-up again in 6 to 8 weeks

## 2022-01-22 NOTE — Assessment & Plan Note (Signed)
Continue tightness on the right side.  He does have a nerve impingement on the right side of the lower back.  Discussed icing regimen and home exercises, patient can have the nerve root injections if necessary.  Follow-up with me again in 6 to 8 weeks

## 2022-03-01 NOTE — Progress Notes (Signed)
  [place] Phone: [phone] Subjective:    I'm seeing this patient by the request  of:  Cari Caraway, MD  CC: back and neck pain follo wup   [id number]  Dorothy [name] is a 66 y.o. female coming in with complaint of back and neck pain. OMT 01/22/2022. Also f.u for R knee pain. Patient states that she is okay , a lot of aches and pains , states that bilateral heel pain left worst than right , hip and the knee are normal   Medications patient has been prescribed: None  Taking:         Reviewed prior external information including notes and imaging from previsou exam, outside providers and external EMR if available.   As well as notes that were available from care everywhere and other healthcare systems.  Past medical history, social, surgical and family history all reviewed in electronic medical record.  No pertanent information unless stated regarding to the chief complaint.   No past medical history on file.  No Known Allergies   Review of Systems:  No headache, visual changes, nausea, vomiting, diarrhea, constipation, dizziness, abdominal pain, skin rash, fevers, chills, night sweats, weight loss, swollen lymph nodes, body aches, joint swelling, chest pain, shortness of breath, mood changes. POSITIVE muscle aches  Objective  Blood pressure 110/80, pulse (!) 57, height '5\' 1"'$  (1.549 m), weight 116 lb (52.6 kg), last menstrual period 04/16/2008, SpO2 97 %.   General: No apparent distress alert and oriented x3 mood and affect normal, dressed appropriately.  HEENT: Pupils equal, extraocular movements intact  Respiratory: Patient's speak in full sentences and does not appear short of breath  Cardiovascular: No lower extremity edema, non tender, no erythema  Neck exam still has some mild limited range of motion especially with sidebending bilaterally.  Patient does have some very mild crepitus  noted.  Osteopathic findings  C2 flexed rotated and side bent right C5 flexed rotated and side bent left C7 flexed rotated and side bent right T3 extended rotated and side bent right inhaled rib T9 extended rotated and side bent left L2 flexed rotated and side bent right Sacrum right on right       Assessment and Plan:  Cervicogenic headache Continues to have tightness of the neck noted.  Discussed with patient about icing regimen and home exercises.  Still a lot of stress with patient's significant other still having significant dementia.  Discussed icing regimen and home exercises otherwise.  Taking time for herself.  Follow-up again in 6 to 8 weeks.    Nonallopathic problems  Decision today to treat with OMT was based on Physical Exam  After verbal consent patient was treated with HVLA, ME, FPR techniques in cervical, rib, thoracic, lumbar, and sacral  areas  Patient tolerated the procedure well with improvement in symptoms  Patient given exercises, stretches and lifestyle modifications  See medications in patient instructions if given  Patient will follow up in 4-8 weeks     The above documentation has been reviewed and is accurate and complete [name], DO         Note: This dictation was prepared with Dragon dictation along with smaller phrase technology. Any transcriptional errors that result from this process are unintentional.

## 2022-03-05 ENCOUNTER — Ambulatory Visit: Payer: Medicare HMO | Admitting: Family Medicine

## 2022-03-05 VITALS — BP 110/80 | HR 57 | Ht 61.0 in | Wt 116.0 lb

## 2022-03-05 DIAGNOSIS — M9908 Segmental and somatic dysfunction of rib cage: Secondary | ICD-10-CM

## 2022-03-05 DIAGNOSIS — M9904 Segmental and somatic dysfunction of sacral region: Secondary | ICD-10-CM | POA: Diagnosis not present

## 2022-03-05 DIAGNOSIS — M9901 Segmental and somatic dysfunction of cervical region: Secondary | ICD-10-CM | POA: Diagnosis not present

## 2022-03-05 DIAGNOSIS — M9902 Segmental and somatic dysfunction of thoracic region: Secondary | ICD-10-CM | POA: Diagnosis not present

## 2022-03-05 DIAGNOSIS — M9903 Segmental and somatic dysfunction of lumbar region: Secondary | ICD-10-CM

## 2022-03-05 DIAGNOSIS — G4486 Cervicogenic headache: Secondary | ICD-10-CM

## 2022-03-05 NOTE — Assessment & Plan Note (Signed)
Continues to have tightness of the neck noted.  Discussed with patient about icing regimen and home exercises.  Still a lot of stress with patient's significant other still having significant dementia.  Discussed icing regimen and home exercises otherwise.  Taking time for herself.  Follow-up again in 6 to 8 weeks.

## 2022-03-05 NOTE — Patient Instructions (Signed)
Great to see you  Happy holidays  7-8 week follow up

## 2022-03-19 DIAGNOSIS — H5711 Ocular pain, right eye: Secondary | ICD-10-CM | POA: Diagnosis not present

## 2022-03-19 DIAGNOSIS — S0501XA Injury of conjunctiva and corneal abrasion without foreign body, right eye, initial encounter: Secondary | ICD-10-CM | POA: Diagnosis not present

## 2022-03-21 DIAGNOSIS — S0502XD Injury of conjunctiva and corneal abrasion without foreign body, left eye, subsequent encounter: Secondary | ICD-10-CM | POA: Diagnosis not present

## 2022-04-24 NOTE — Progress Notes (Unsigned)
  [place] Phone: [phone] Subjective:   Dorothy Dorothy [name], am serving as a scribe for Dr. Hulan Saas.  I'm seeing this patient by the request  of:  Cari Caraway, MD  CC: back and neck pain follow up   [id number]  Dorothy Dorothy [name] is a 67 y.o. female coming in with complaint of back and neck pain. OMT 03/05/2022.  Also f/u for R knee pain. Patient states that her R hip and lumbar spine pain is radiating into the knee. Knee seems to take most of the pain.   Medications patient has been prescribed: None  Taking:         Reviewed prior external information including notes and imaging from previsou exam, outside providers and external EMR if available.   As well as notes that were available from care everywhere and other healthcare systems.  Past medical history, social, surgical and family history all reviewed in electronic medical record.  Dorothy [name] pertanent information unless stated regarding to the chief complaint.   Dorothy [name] past medical history on file.  Dorothy [name] Known Allergies   Review of Systems:  Dorothy [name] headache, visual changes, nausea, vomiting, diarrhea, constipation, dizziness, abdominal pain, skin rash, fevers, chills, night sweats, weight loss, swollen lymph nodes, body aches, joint swelling, chest pain, shortness of breath, mood changes. POSITIVE muscle aches  Objective  Blood pressure 132/84, pulse (!) 57, height '5\' 1"'$  (1.549 m), weight 119 lb (54 kg), last menstrual period 04/16/2008, SpO2 99 %.   General: Dorothy [name] apparent distress alert and oriented x3 mood and affect normal, dressed appropriately.  HEENT: Pupils equal, extraocular movements intact  Respiratory: Patient's speak in full sentences and does not appear short of breath  Cardiovascular: Dorothy [name] lower extremity edema, non tender, Dorothy [name] erythema  Gait MSK:  Back does have loss of lordosis tightness in the neck  Mild crepitus  Tightness of neck as  well    Osteopathic findings  C3 flexed rotated and side bent right C5 flexed rotated and side bent left T3 extended rotated and side bent right inhaled rib T8 extended rotated and side bent left L2 flexed rotated and side bent right Sacrum right on right    Assessment and Plan:  Cervicogenic headache Chronic more exacerbation  Discussed HEP  Toradol and depo given  Discussed which activities to do and which ones to avoid.     Nonallopathic problems  Decision today to treat with OMT was based on Physical Exam  After verbal consent patient was treated with HVLA, ME, FPR techniques in cervical, rib, thoracic, lumbar, and sacral  areas  Patient tolerated the procedure well with improvement in symptoms  Patient given exercises, stretches and lifestyle modifications  See medications in patient instructions if given  Patient will follow up in 4-8 weeks     The above documentation has been reviewed and is accurate and complete [name], DO         Note: This dictation was prepared with Dragon dictation along with smaller phrase technology. Any transcriptional errors that result from this process are unintentional.

## 2022-04-30 ENCOUNTER — Ambulatory Visit: Payer: Medicare HMO | Admitting: Family Medicine

## 2022-04-30 ENCOUNTER — Ambulatory Visit (INDEPENDENT_AMBULATORY_CARE_PROVIDER_SITE_OTHER): Payer: Medicare HMO

## 2022-04-30 VITALS — BP 132/84 | HR 57 | Ht 61.0 in | Wt 119.0 lb

## 2022-04-30 DIAGNOSIS — M9903 Segmental and somatic dysfunction of lumbar region: Secondary | ICD-10-CM

## 2022-04-30 DIAGNOSIS — M9901 Segmental and somatic dysfunction of cervical region: Secondary | ICD-10-CM

## 2022-04-30 DIAGNOSIS — G4486 Cervicogenic headache: Secondary | ICD-10-CM | POA: Diagnosis not present

## 2022-04-30 DIAGNOSIS — M9902 Segmental and somatic dysfunction of thoracic region: Secondary | ICD-10-CM | POA: Diagnosis not present

## 2022-04-30 DIAGNOSIS — M5416 Radiculopathy, lumbar region: Secondary | ICD-10-CM | POA: Diagnosis not present

## 2022-04-30 DIAGNOSIS — M9904 Segmental and somatic dysfunction of sacral region: Secondary | ICD-10-CM

## 2022-04-30 DIAGNOSIS — M9908 Segmental and somatic dysfunction of rib cage: Secondary | ICD-10-CM

## 2022-04-30 DIAGNOSIS — M25551 Pain in right hip: Secondary | ICD-10-CM

## 2022-04-30 MED ORDER — METHYLPREDNISOLONE ACETATE 80 MG/ML IJ SUSP
80.0000 mg | Freq: Once | INTRAMUSCULAR | Status: AC
  Administered 2022-04-30: 80 mg via INTRAMUSCULAR

## 2022-04-30 MED ORDER — KETOROLAC TROMETHAMINE 60 MG/2ML IM SOLN
60.0000 mg | Freq: Once | INTRAMUSCULAR | Status: AC
  Administered 2022-04-30: 60 mg via INTRAMUSCULAR

## 2022-04-30 NOTE — Patient Instructions (Addendum)
Nerve root ordered Injection in backside Firefly muscle recovery TENS unit Xray today for hip See me again in 5-6 weeks

## 2022-05-01 NOTE — Assessment & Plan Note (Signed)
Chronic more exacerbation  Discussed HEP  Toradol and depo given  Discussed which activities to do and which ones to avoid.

## 2022-05-14 DIAGNOSIS — Z6822 Body mass index (BMI) 22.0-22.9, adult: Secondary | ICD-10-CM | POA: Diagnosis not present

## 2022-05-14 DIAGNOSIS — R030 Elevated blood-pressure reading, without diagnosis of hypertension: Secondary | ICD-10-CM | POA: Diagnosis not present

## 2022-05-14 DIAGNOSIS — H8113 Benign paroxysmal vertigo, bilateral: Secondary | ICD-10-CM | POA: Diagnosis not present

## 2022-05-29 DIAGNOSIS — R051 Acute cough: Secondary | ICD-10-CM | POA: Diagnosis not present

## 2022-05-29 DIAGNOSIS — R52 Pain, unspecified: Secondary | ICD-10-CM | POA: Diagnosis not present

## 2022-05-29 DIAGNOSIS — Z03818 Encounter for observation for suspected exposure to other biological agents ruled out: Secondary | ICD-10-CM | POA: Diagnosis not present

## 2022-05-29 DIAGNOSIS — B349 Viral infection, unspecified: Secondary | ICD-10-CM | POA: Diagnosis not present

## 2022-05-29 DIAGNOSIS — R509 Fever, unspecified: Secondary | ICD-10-CM | POA: Diagnosis not present

## 2022-05-29 DIAGNOSIS — Z6822 Body mass index (BMI) 22.0-22.9, adult: Secondary | ICD-10-CM | POA: Diagnosis not present

## 2022-06-04 ENCOUNTER — Ambulatory Visit
Admission: RE | Admit: 2022-06-04 | Discharge: 2022-06-04 | Disposition: A | Discharge status: Home / Self Care | Payer: Medicare HMO | Source: Ambulatory Visit | Attending: Family Medicine | Admitting: Family Medicine

## 2022-06-04 DIAGNOSIS — M5416 Radiculopathy, lumbar region: Secondary | ICD-10-CM

## 2022-06-04 DIAGNOSIS — M4727 Other spondylosis with radiculopathy, lumbosacral region: Secondary | ICD-10-CM | POA: Diagnosis not present

## 2022-06-04 MED ORDER — IOPAMIDOL (ISOVUE-M 200) INJECTION 41%
1.0000 mL | Freq: Once | INTRAMUSCULAR | Status: AC
  Administered 2022-06-04: 1 mL via EPIDURAL

## 2022-06-04 MED ORDER — METHYLPREDNISOLONE ACETATE 40 MG/ML INJ SUSP (RADIOLOG
80.0000 mg | Freq: Once | INTRAMUSCULAR | Status: AC
  Administered 2022-06-04: 80 mg via EPIDURAL

## 2022-06-04 NOTE — Discharge Instructions (Signed)

## 2022-06-11 ENCOUNTER — Ambulatory Visit: Payer: Medicare HMO | Admitting: Family Medicine

## 2022-06-13 DIAGNOSIS — H2513 Age-related nuclear cataract, bilateral: Secondary | ICD-10-CM | POA: Diagnosis not present

## 2022-06-13 DIAGNOSIS — H18593 Other hereditary corneal dystrophies, bilateral: Secondary | ICD-10-CM | POA: Diagnosis not present

## 2022-06-13 DIAGNOSIS — H52203 Unspecified astigmatism, bilateral: Secondary | ICD-10-CM | POA: Diagnosis not present

## 2022-06-13 DIAGNOSIS — H1820 Unspecified corneal edema: Secondary | ICD-10-CM | POA: Diagnosis not present

## 2022-06-25 DIAGNOSIS — Z1231 Encounter for screening mammogram for malignant neoplasm of breast: Secondary | ICD-10-CM | POA: Diagnosis not present

## 2022-06-27 DIAGNOSIS — H18593 Other hereditary corneal dystrophies, bilateral: Secondary | ICD-10-CM | POA: Diagnosis not present

## 2022-06-27 DIAGNOSIS — H1820 Unspecified corneal edema: Secondary | ICD-10-CM | POA: Diagnosis not present

## 2022-07-04 ENCOUNTER — Encounter: Payer: Self-pay | Admitting: Podiatry

## 2022-07-04 ENCOUNTER — Ambulatory Visit (INDEPENDENT_AMBULATORY_CARE_PROVIDER_SITE_OTHER): Payer: Medicare HMO | Admitting: Podiatry

## 2022-07-04 DIAGNOSIS — B351 Tinea unguium: Secondary | ICD-10-CM | POA: Diagnosis not present

## 2022-07-04 DIAGNOSIS — D169 Benign neoplasm of bone and articular cartilage, unspecified: Secondary | ICD-10-CM | POA: Diagnosis not present

## 2022-07-04 DIAGNOSIS — L84 Corns and callosities: Secondary | ICD-10-CM | POA: Diagnosis not present

## 2022-07-04 NOTE — Progress Notes (Signed)
Subjective:   Patient ID: Dorothy [name], female   DOB: 67 y.o.   MRN: [id number]   HPI Patient presents with several problems with 1 being discoloration of the left big toenail of a number years duration and lesion on the second digit right hallux left that become periodically tender especially second digit right.  Patient tries to use pumice stone on these areas does not smoke likes to be active   Review of Systems  All other systems reviewed and are negative.       Objective:  Physical Exam Vitals and nursing note reviewed.  Constitutional:      Appearance: She is well-developed.  Pulmonary:     Effort: Pulmonary effort is normal.  Musculoskeletal:        General: Normal range of motion.  Skin:    General: Skin is warm.  Neurological:     Mental Status: She is alert.     Neurovascular status was found to be intact muscle strength was found to be adequate range of motion found to be adequate.  I did note there to be moderate deviation of the hallux against the second digits bilateral with distal keratotic lesion digit to right that of the medial side of the left big toe that are moderately tender and has a significant damage of the left big toenail that has been this way for years with patient being a tennis player and doing other activities.  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  Patient will be seen back as needed

## 2022-07-11 ENCOUNTER — Ambulatory Visit: Payer: Medicare HMO | Admitting: Podiatry

## 2022-07-11 DIAGNOSIS — Z0100 Encounter for examination of eyes and vision without abnormal findings: Secondary | ICD-10-CM | POA: Diagnosis not present

## 2022-07-20 NOTE — Progress Notes (Unsigned)
  [name] [address] [place] [address] Phone: [phone] Subjective:   [name], am serving as a scribe for Dr. Antoine Primas.  I'm seeing this patient by the request  of:  [name], MD  CC: Back and neck pain follow-up  [id number]  Dorothy [name] is a 67 y.o. female coming in with complaint of back and neck pain. OMT 04/30/2022. NR injection 06/04/2022. Patient states doing okay. Injection only worked about 60% compared to times in the past. Still getting around, but pain is messing with her golf swing. No other concerns.  Medications patient has been prescribed: None      Reviewed prior external information including notes and imaging from previsou exam, outside providers and external EMR if available.   As well as notes that were available from care everywhere and other healthcare systems.  Past medical history, social, surgical and family history all reviewed in electronic medical record.  No pertanent information unless stated regarding to the chief complaint.    Review of Systems:  No visual changes, nausea, vomiting, diarrhea, constipation, dizziness, abdominal pain, skin rash, fevers, chills, night sweats, weight loss, swollen lymph nodes, , joint swelling, chest pain, shortness of breath, mood changes. POSITIVE muscle aches, body aches, headache  Objective  Height 5\' 1"  (1.549 m), last menstrual period 04/16/2008.   General: No apparent distress alert and oriented x3 mood and affect normal, dressed appropriately.  HEENT: Pupils equal, extraocular movements intact  Respiratory: Patient's speak in full sentences and does not appear short of breath  Cardiovascular: No lower extremity edema, non tender, no erythema  The patient does have some loss lordosis  Osteopathic findings  C2 flexed rotated and side bent right C7 flexed rotated and side bent left T3 extended rotated and side bent right inhaled rib T8  extended rotated and side bent left L3 flexed rotated and side bent right Sacrum right on right     Assessment and Plan:  Sacroiliac joint dysfunction of right side Continues to have difficulty with this as well as patient's low back.  Still wants to avoid any type of surgical intervention.  Continues to have some difficulty and will be traveling in the near future.  Did respond somewhat to a nerve root injection.  Did not feel that the medial branch blocks will have made any significant improvement.  We discussed icing regimen and home exercises. Injections in the office of Toradol and Depo-Medrol did not help significantly either.  We will continue to monitor and follow-up with me again in 6 to 8 weeks    Nonallopathic problems  Decision today to treat with OMT was based on Physical Exam  After verbal consent patient was treated with HVLA, ME, FPR techniques in cervical, rib, thoracic, lumbar, and sacral  areas  Patient tolerated the procedure well with improvement in symptoms  Patient given exercises, stretches and lifestyle modifications  See medications in patient instructions if given  Patient will follow up in 4-8 weeks    The above documentation has been reviewed and is accurate and complete Judi Saa, DO          Note: This dictation was prepared with Dragon dictation along with smaller phrase technology. Any transcriptional errors that result from this process are unintentional.

## 2022-07-23 ENCOUNTER — Ambulatory Visit: Payer: Medicare HMO | Admitting: Family Medicine

## 2022-07-23 ENCOUNTER — Encounter: Payer: Self-pay | Admitting: Family Medicine

## 2022-07-23 VITALS — Ht 61.0 in

## 2022-07-23 DIAGNOSIS — M9903 Segmental and somatic dysfunction of lumbar region: Secondary | ICD-10-CM | POA: Diagnosis not present

## 2022-07-23 DIAGNOSIS — M9901 Segmental and somatic dysfunction of cervical region: Secondary | ICD-10-CM | POA: Diagnosis not present

## 2022-07-23 DIAGNOSIS — M9902 Segmental and somatic dysfunction of thoracic region: Secondary | ICD-10-CM | POA: Diagnosis not present

## 2022-07-23 DIAGNOSIS — M9904 Segmental and somatic dysfunction of sacral region: Secondary | ICD-10-CM | POA: Diagnosis not present

## 2022-07-23 DIAGNOSIS — M9908 Segmental and somatic dysfunction of rib cage: Secondary | ICD-10-CM | POA: Diagnosis not present

## 2022-07-23 DIAGNOSIS — M533 Sacrococcygeal disorders, not elsewhere classified: Secondary | ICD-10-CM | POA: Diagnosis not present

## 2022-07-23 NOTE — Assessment & Plan Note (Signed)
Continues to have difficulty with this as well as patient's low back.  Still wants to avoid any type of surgical intervention.  Continues to have some difficulty and will be traveling in the near future.  Did respond somewhat to a nerve root injection.  Did not feel that the medial branch blocks will have made any significant improvement.  We discussed icing regimen and home exercises. Injections in the office of Toradol and Depo-Medrol did not help significantly either.  We will continue to monitor and follow-up with me again in 6 to 8 weeks

## 2022-08-23 ENCOUNTER — Other Ambulatory Visit: Payer: Self-pay

## 2022-08-23 ENCOUNTER — Encounter: Payer: Self-pay | Admitting: Family Medicine

## 2022-08-23 DIAGNOSIS — M545 Low back pain, unspecified: Secondary | ICD-10-CM

## 2022-09-03 ENCOUNTER — Ambulatory Visit: Payer: Medicare HMO | Admitting: Family Medicine

## 2022-09-11 ENCOUNTER — Ambulatory Visit
Admission: RE | Admit: 2022-09-11 | Discharge: 2022-09-11 | Disposition: A | Discharge status: Home / Self Care | Payer: Medicare HMO | Source: Ambulatory Visit | Attending: Family Medicine | Admitting: Family Medicine

## 2022-09-11 DIAGNOSIS — M25551 Pain in right hip: Secondary | ICD-10-CM | POA: Diagnosis not present

## 2022-09-11 DIAGNOSIS — M545 Low back pain, unspecified: Secondary | ICD-10-CM

## 2022-09-11 DIAGNOSIS — M25561 Pain in right knee: Secondary | ICD-10-CM | POA: Diagnosis not present

## 2022-09-11 DIAGNOSIS — M4727 Other spondylosis with radiculopathy, lumbosacral region: Secondary | ICD-10-CM | POA: Diagnosis not present

## 2022-09-11 MED ORDER — IOPAMIDOL (ISOVUE-M 200) INJECTION 41%
1.0000 mL | Freq: Once | INTRAMUSCULAR | Status: AC
  Administered 2022-09-11: 1 mL via EPIDURAL

## 2022-09-11 MED ORDER — METHYLPREDNISOLONE ACETATE 40 MG/ML INJ SUSP (RADIOLOG
80.0000 mg | Freq: Once | INTRAMUSCULAR | Status: AC
  Administered 2022-09-11: 80 mg via EPIDURAL

## 2022-09-11 NOTE — Discharge Instructions (Signed)

## 2022-09-27 NOTE — Progress Notes (Signed)
  Dorothy [name] [address] [place] [address] Phone: [phone] Subjective:   [name], am serving as a scribe for Dr. Antoine Primas.  I'm seeing this patient by the request  of:  [name], MD  CC: Back and neck pain follow-up  [id number]  SHANN RADCLIFF is a 67 y.o. female coming in with complaint of back and neck pain. OMT 07/23/2022. Patient states same per usual. No concerns.  Continues to have discomfort.  Nothing that stopping her from activity but can be very aggravating even at night sometimes.  Medications patient has been prescribed: None         Reviewed prior external information including notes and imaging from previsou exam, outside providers and external EMR if available.   As well as notes that were available from care everywhere and other healthcare systems.  Past medical history, social, surgical and family history all reviewed in electronic medical record.  No pertanent information unless stated regarding to the chief complaint.   No past medical history on file.  No Known Allergies   Review of Systems:  No headache, visual changes, nausea, vomiting, diarrhea, constipation, dizziness, abdominal pain, skin rash, fevers, chills, night sweats, weight loss, swollen lymph nodes, body aches, joint swelling, chest pain, shortness of breath, mood changes. POSITIVE muscle aches  Objective  Blood pressure 112/84, pulse 75, height 5\' 1"  (1.549 m), weight 115 lb (52.2 kg), last menstrual period 04/16/2008, SpO2 97 %.   General: No apparent distress alert and oriented x3 mood and affect normal, dressed appropriately.  HEENT: Pupils equal, extraocular movements intact  Respiratory: Patient's speak in full sentences and does not appear short of breath  Cardiovascular: No lower extremity edema, non tender, no erythema    Osteopathic findings  C2 flexed rotated and side bent right C6 flexed rotated and side bent  left T3 extended rotated and side bent right inhaled rib T9 extended rotated and side bent left L2 flexed rotated and side bent right Sacrum right on right       Assessment and Plan:  Sacroiliac joint dysfunction of right side Continue problem.  Discussed with patient again about core strengthening.  Does a lot of racquet sports and repetitive rotation.  Do think that this can can contribute.  Discussed avoiding certain activities.  No nerve impingement send could potentially consider another epidural but did have another 1 not too long ago.  Follow-up with me again in 6 to 8 weeks otherwise.    Nonallopathic problems  Decision today to treat with OMT was based on Physical Exam  After verbal consent patient was treated with HVLA, ME, FPR techniques in cervical, rib, thoracic, lumbar, and sacral  areas  Patient tolerated the procedure well with improvement in symptoms  Patient given exercises, stretches and lifestyle modifications  See medications in patient instructions if given  Patient will follow up in 4-8 weeks    The above documentation has been reviewed and is accurate and complete Judi Saa, DO          Note: This dictation was prepared with Dragon dictation along with smaller phrase technology. Any transcriptional errors that result from this process are unintentional.

## 2022-10-01 ENCOUNTER — Encounter: Payer: Self-pay | Admitting: Family Medicine

## 2022-10-01 ENCOUNTER — Ambulatory Visit: Payer: Medicare HMO | Admitting: Family Medicine

## 2022-10-01 VITALS — BP 112/84 | HR 75 | Ht 61.0 in | Wt 115.0 lb

## 2022-10-01 DIAGNOSIS — M9908 Segmental and somatic dysfunction of rib cage: Secondary | ICD-10-CM

## 2022-10-01 DIAGNOSIS — M9904 Segmental and somatic dysfunction of sacral region: Secondary | ICD-10-CM

## 2022-10-01 DIAGNOSIS — M9902 Segmental and somatic dysfunction of thoracic region: Secondary | ICD-10-CM | POA: Diagnosis not present

## 2022-10-01 DIAGNOSIS — M533 Sacrococcygeal disorders, not elsewhere classified: Secondary | ICD-10-CM

## 2022-10-01 DIAGNOSIS — M9903 Segmental and somatic dysfunction of lumbar region: Secondary | ICD-10-CM | POA: Diagnosis not present

## 2022-10-01 DIAGNOSIS — M9901 Segmental and somatic dysfunction of cervical region: Secondary | ICD-10-CM | POA: Diagnosis not present

## 2022-10-01 NOTE — Assessment & Plan Note (Signed)
Continue problem.  Discussed with patient again about core strengthening.  Does a lot of racquet sports and repetitive rotation.  Do think that this can can contribute.  Discussed avoiding certain activities.  No nerve impingement send could potentially consider another epidural but did have another 1 not too long ago.  Follow-up with me again in 6 to 8 weeks otherwise.

## 2022-10-12 DIAGNOSIS — M8588 Other specified disorders of bone density and structure, other site: Secondary | ICD-10-CM | POA: Diagnosis not present

## 2022-10-12 DIAGNOSIS — E785 Hyperlipidemia, unspecified: Secondary | ICD-10-CM | POA: Diagnosis not present

## 2022-10-12 DIAGNOSIS — Z131 Encounter for screening for diabetes mellitus: Secondary | ICD-10-CM | POA: Diagnosis not present

## 2022-10-22 ENCOUNTER — Other Ambulatory Visit (HOSPITAL_BASED_OUTPATIENT_CLINIC_OR_DEPARTMENT_OTHER): Payer: Self-pay | Admitting: Family Medicine

## 2022-10-22 DIAGNOSIS — Z9189 Other specified personal risk factors, not elsewhere classified: Secondary | ICD-10-CM | POA: Diagnosis not present

## 2022-10-22 DIAGNOSIS — Z6821 Body mass index (BMI) 21.0-21.9, adult: Secondary | ICD-10-CM | POA: Diagnosis not present

## 2022-10-22 DIAGNOSIS — Z0000 Encounter for general adult medical examination without abnormal findings: Secondary | ICD-10-CM | POA: Diagnosis not present

## 2022-10-22 DIAGNOSIS — E7800 Pure hypercholesterolemia, unspecified: Secondary | ICD-10-CM | POA: Diagnosis not present

## 2022-10-22 DIAGNOSIS — Z1331 Encounter for screening for depression: Secondary | ICD-10-CM | POA: Diagnosis not present

## 2022-10-22 DIAGNOSIS — M8588 Other specified disorders of bone density and structure, other site: Secondary | ICD-10-CM | POA: Diagnosis not present

## 2022-10-30 DIAGNOSIS — Z6821 Body mass index (BMI) 21.0-21.9, adult: Secondary | ICD-10-CM | POA: Diagnosis not present

## 2022-10-30 DIAGNOSIS — H5702 Anisocoria: Secondary | ICD-10-CM | POA: Diagnosis not present

## 2022-11-02 DIAGNOSIS — G43909 Migraine, unspecified, not intractable, without status migrainosus: Secondary | ICD-10-CM | POA: Diagnosis not present

## 2022-11-06 ENCOUNTER — Ambulatory Visit (HOSPITAL_COMMUNITY)
Admission: RE | Admit: 2022-11-06 | Discharge: 2022-11-06 | Disposition: A | Discharge status: Home / Self Care | Payer: Medicare HMO | Source: Ambulatory Visit | Attending: Family Medicine | Admitting: Family Medicine

## 2022-11-06 DIAGNOSIS — E7800 Pure hypercholesterolemia, unspecified: Secondary | ICD-10-CM | POA: Insufficient documentation

## 2022-11-15 NOTE — Progress Notes (Unsigned)
  Dorothy [name] [address] [place] [address] Phone: [phone] Subjective:   Dorothy [name], am serving as a scribe for Dr. Antoine Primas.  I'm seeing this patient by the request  of:  [name], MD  CC: Neck and back pain follow-up  [id number]  Dorothy [name] is a 67 y.o. female coming in with complaint of back and neck pain. OMT on 10/01/2022. Patient states same per usual. No new concerns.  Medications patient has been prescribed:   Taking:         Reviewed prior external information including notes and imaging from previsou exam, outside providers and external EMR if available.   As well as notes that were available from care everywhere and other healthcare systems.  Past medical history, social, surgical and family history all reviewed in electronic medical record.  No pertanent information unless stated regarding to the chief complaint.   No past medical history on file.  No Known Allergies   Review of Systems:  No headache, visual changes, nausea, vomiting, diarrhea, constipation, dizziness, abdominal pain, skin rash, fevers, chills, night sweats, weight loss, swollen lymph nodes, body aches, joint swelling, chest pain, shortness of breath, mood changes. POSITIVE muscle aches  Objective  Blood pressure 114/72, pulse 63, height 5\' 1"  (1.549 m), last menstrual period 04/16/2008, SpO2 96%.   General: No apparent distress alert and oriented x3 mood and affect normal, dressed appropriately.  HEENT: Pupils equal, extraocular movements intact  Respiratory: Patient's speak in full sentences and does not appear short of breath  Cardiovascular: No lower extremity edema, non tender, no erythema  Gait MSK:  Back worsening pain in the piriformis area.  Some tenderness to palpation of the paraspinal musculature.  Tightness noted with FABER test.  Negative straight leg test.  Osteopathic findings  C5 flexed rotated and side  bent right T3 extended rotated and side bent right inhaled rib T8 extended rotated and side bent left L2 flexed rotated and side bent right Sacrum right on right       Assessment and Plan:  Sacroiliac joint dysfunction of right side Continued pain in this area.  Does have more of a piriformis syndrome as well.  We discussed icing regimen and home exercises.  We discussed which activities to do and which ones to avoid.  Increase activity slowly otherwise.  We discussed the piriformis and considering injection if needed.  Will follow-up in 6 to 8 weeks    Nonallopathic problems  Decision today to treat with OMT was based on Physical Exam  After verbal consent patient was treated with HVLA, ME, FPR techniques in cervical, rib, thoracic, lumbar, and sacral  areas  Patient tolerated the procedure well with improvement in symptoms  Patient given exercises, stretches and lifestyle modifications  See medications in patient instructions if given  Patient will follow up in 4-8 weeks     The above documentation has been reviewed and is accurate and complete Judi Saa, DO         Note: This dictation was prepared with Dragon dictation along with smaller phrase technology. Any transcriptional errors that result from this process are unintentional.

## 2022-11-21 ENCOUNTER — Encounter: Payer: Self-pay | Admitting: Family Medicine

## 2022-11-21 ENCOUNTER — Ambulatory Visit: Payer: Medicare HMO | Admitting: Family Medicine

## 2022-11-21 VITALS — BP 114/72 | HR 63 | Ht 61.0 in

## 2022-11-21 DIAGNOSIS — M9903 Segmental and somatic dysfunction of lumbar region: Secondary | ICD-10-CM

## 2022-11-21 DIAGNOSIS — M9902 Segmental and somatic dysfunction of thoracic region: Secondary | ICD-10-CM | POA: Diagnosis not present

## 2022-11-21 DIAGNOSIS — M9901 Segmental and somatic dysfunction of cervical region: Secondary | ICD-10-CM | POA: Diagnosis not present

## 2022-11-21 DIAGNOSIS — M9904 Segmental and somatic dysfunction of sacral region: Secondary | ICD-10-CM | POA: Diagnosis not present

## 2022-11-21 DIAGNOSIS — M533 Sacrococcygeal disorders, not elsewhere classified: Secondary | ICD-10-CM | POA: Diagnosis not present

## 2022-11-21 DIAGNOSIS — M9908 Segmental and somatic dysfunction of rib cage: Secondary | ICD-10-CM

## 2022-11-21 NOTE — Assessment & Plan Note (Signed)
Continued pain in this area.  Does have more of a piriformis syndrome as well.  We discussed icing regimen and home exercises.  We discussed which activities to do and which ones to avoid.  Increase activity slowly otherwise.  We discussed the piriformis and considering injection if needed.  Will follow-up in 6 to 8 weeks

## 2022-11-21 NOTE — Patient Instructions (Signed)
Tennis ball in back pocket We can try piriformis or GT injection next time if necessary See you again in 6-8 weeks

## 2022-12-25 ENCOUNTER — Encounter: Payer: Self-pay | Admitting: Family Medicine

## 2022-12-25 ENCOUNTER — Other Ambulatory Visit: Payer: Self-pay

## 2022-12-25 DIAGNOSIS — M5416 Radiculopathy, lumbar region: Secondary | ICD-10-CM

## 2022-12-26 ENCOUNTER — Encounter: Payer: Self-pay | Admitting: Family Medicine

## 2022-12-31 NOTE — Discharge Instructions (Signed)

## 2023-01-01 ENCOUNTER — Ambulatory Visit
Admission: RE | Admit: 2023-01-01 | Discharge: 2023-01-01 | Disposition: A | Discharge status: Home / Self Care | Payer: Medicare HMO | Source: Ambulatory Visit | Attending: Family Medicine | Admitting: Family Medicine

## 2023-01-01 DIAGNOSIS — M4727 Other spondylosis with radiculopathy, lumbosacral region: Secondary | ICD-10-CM | POA: Diagnosis not present

## 2023-01-01 DIAGNOSIS — M5416 Radiculopathy, lumbar region: Secondary | ICD-10-CM

## 2023-01-01 MED ORDER — IOPAMIDOL (ISOVUE-M 200) INJECTION 41%
1.0000 mL | Freq: Once | INTRAMUSCULAR | Status: AC
  Administered 2023-01-01: 1 mL via EPIDURAL

## 2023-01-01 MED ORDER — METHYLPREDNISOLONE ACETATE 40 MG/ML INJ SUSP (RADIOLOG
80.0000 mg | Freq: Once | INTRAMUSCULAR | Status: AC
  Administered 2023-01-01: 80 mg via EPIDURAL

## 2023-01-02 DIAGNOSIS — L821 Other seborrheic keratosis: Secondary | ICD-10-CM | POA: Diagnosis not present

## 2023-01-02 DIAGNOSIS — D1801 Hemangioma of skin and subcutaneous tissue: Secondary | ICD-10-CM | POA: Diagnosis not present

## 2023-01-02 DIAGNOSIS — L2089 Other atopic dermatitis: Secondary | ICD-10-CM | POA: Diagnosis not present

## 2023-01-02 DIAGNOSIS — L814 Other melanin hyperpigmentation: Secondary | ICD-10-CM | POA: Diagnosis not present

## 2023-01-03 NOTE — Progress Notes (Deleted)
  [name] [address] [place] [address] Phone: [phone] Subjective:    I'm seeing this patient by the request  of:  [name], MD  CC:   [id number]  Dorothy [name] is a 67 y.o. female coming in with complaint of back and neck pain. OMT 11/21/2022. Patient states   Medications patient has been prescribed: None  Taking:         Reviewed prior external information including notes and imaging from previsou exam, outside providers and external EMR if available.   As well as notes that were available from care everywhere and other healthcare systems.  Past medical history, social, surgical and family history all reviewed in electronic medical record.  No pertanent information unless stated regarding to the chief complaint.   No past medical history on file.  No Known Allergies   Review of Systems:  No headache, visual changes, nausea, vomiting, diarrhea, constipation, dizziness, abdominal pain, skin rash, fevers, chills, night sweats, weight loss, swollen lymph nodes, body aches, joint swelling, chest pain, shortness of breath, mood changes. POSITIVE muscle aches  Objective  Last menstrual period 04/16/2008.   General: No apparent distress alert and oriented x3 mood and affect normal, dressed appropriately.  HEENT: Pupils equal, extraocular movements intact  Respiratory: Patient's speak in full sentences and does not appear short of breath  Cardiovascular: No lower extremity edema, non tender, no erythema  Gait MSK:  Back   Osteopathic findings  C2 flexed rotated and side bent right C6 flexed rotated and side bent left T3 extended rotated and side bent right inhaled rib T9 extended rotated and side bent left L2 flexed rotated and side bent right Sacrum right on right       Assessment and Plan:  No problem-specific Assessment & Plan notes found for this encounter.    Nonallopathic problems  Decision  today to treat with OMT was based on Physical Exam  After verbal consent patient was treated with HVLA, ME, FPR techniques in cervical, rib, thoracic, lumbar, and sacral  areas  Patient tolerated the procedure well with improvement in symptoms  Patient given exercises, stretches and lifestyle modifications  See medications in patient instructions if given  Patient will follow up in 4-8 weeks             Note: This dictation was prepared with Dragon dictation along with smaller phrase technology. Any transcriptional errors that result from this process are unintentional.

## 2023-01-07 ENCOUNTER — Ambulatory Visit: Payer: Medicare HMO | Admitting: Family Medicine

## 2023-01-29 ENCOUNTER — Encounter: Payer: Self-pay | Admitting: Neurology

## 2023-01-29 ENCOUNTER — Ambulatory Visit: Payer: Medicare HMO | Admitting: Neurology

## 2023-01-29 VITALS — BP 137/79 | HR 73 | Ht 61.0 in | Wt 116.8 lb

## 2023-01-29 DIAGNOSIS — H5702 Anisocoria: Secondary | ICD-10-CM | POA: Insufficient documentation

## 2023-01-29 NOTE — Progress Notes (Signed)
Chief Complaint  Patient presents with   New Patient (Initial Visit)    Rm 14. Patient alone, reports last time she saw the change in pupils was in July.       ASSESSMENT AND PLAN  Dorothy [name] is a 67 y.o. female   Unequal pupil size  Overall associated with headache, can be a physiological anisocoria, today's examination is normal, no focal signs,  Advised patient continue observe her symptoms, if remaining to be a concern, may consider MRI of the brain, to rule out structural abnormality,     DIAGNOSTIC DATA (LABS, IMAGING, TESTING) - I reviewed patient records, labs, notes, testing and imaging myself where available.   MEDICAL HISTORY:  Dorothy [name], is a 67 year old female seen in request by ophthalmologist Dr. Emily Filbert, Barbara Cower, for evaluation of unequal pupil size, her primary care is Dr. Corliss Blacker, Toniann Fail, initial evaluation was January 29, 2023  History is obtained from the patient and review of electronic medical records. I personally reviewed pertinent available imaging films in PACS.   She has occasionally headache, but otherwise healthy, physically active, he is the main caregiver of her husband,  Since 2024, she had a few occasion that she noticed unequal pupil size, most recent 1 was in July 2024, she was playing golf, had a horrible headache, hard to focus, but she was able to push through, headache and unequal pupil size last for few hours, then resolved  She had a few other episode, noted on the equal pupil size, seems to always associated with headaches,   In between, she denies lateralized motor or sensory deficit, she denies family history of aneurysm  PHYSICAL EXAM:   Vitals:   01/29/23 0819  BP: 137/79  Pulse: 73  Weight: 116 lb 12.8 oz (53 kg)  Height: 5\' 1"  (1.549 m)   Body mass index is 22.07 kg/m.  PHYSICAL EXAMNIATION:  Gen: NAD, conversant, well nourised, well groomed                     Cardiovascular: Regular rate rhythm, no  peripheral edema, warm, nontender. Eyes: Conjunctivae clear without exudates or hemorrhage Neck: Supple, no carotid bruits. Pulmonary: Clear to auscultation bilaterally   NEUROLOGICAL EXAM:  MENTAL STATUS: Speech/cognition: Awake, alert, oriented to history taking and casual conversation CRANIAL NERVES: CN II: Visual fields are full to confrontation. Pupils are round equal and briskly reactive to light.  Funduscopy examination showed sharp disc bilaterally CN III, IV, VI: extraocular movement are normal. No ptosis. CN V: Facial sensation is intact to light touch CN VII: Face is symmetric with normal eye closure  CN VIII: Hearing is normal to causal conversation. CN IX, X: Phonation is normal. CN XI: Head turning and shoulder shrug are intact  MOTOR: There is no pronator drift of out-stretched arms. Muscle bulk and tone are normal. Muscle strength is normal.  REFLEXES: Reflexes are 2+ and symmetric at the biceps, triceps, knees, and ankles. Plantar responses are flexor.  SENSORY: Intact to light touch, pinprick and vibratory sensation are intact in fingers and toes.  COORDINATION: There is no trunk or limb dysmetria noted.  GAIT/STANCE: Posture is normal. Gait is steady with normal steps, base, arm swing, and turning. Heel and toe walking are normal. Tandem gait is normal.  Romberg is absent.  REVIEW OF SYSTEMS:  Full 14 system review of systems performed and notable only for as above All other review of systems were negative.   ALLERGIES: No Known Allergies  HOME MEDICATIONS: Current Outpatient Medications  Medication Sig Dispense Refill   Multiple Vitamin (MULTIVITAMIN) tablet Take 1 tablet by mouth daily.     magnesium 30 MG tablet Take 500 mg by mouth. (Patient not taking: Reported on 01/29/2023)     No current facility-administered medications for this visit.    PAST MEDICAL HISTORY: No past medical history on file.  PAST SURGICAL HISTORY: Past Surgical  History:  Procedure Laterality Date   CESAREAN SECTION  1994    FAMILY HISTORY: Family History  Problem Relation Age of Onset   Hypertension Mother    Cancer Father        BRAIN TUMOR    SOCIAL HISTORY: Social History   Socioeconomic History   Marital status: Married    Spouse name: Not on file   Number of children: Not on file   Years of education: Not on file   Highest education level: Not on file  Occupational History   Not on file  Tobacco Use   Smoking status: Never   Smokeless tobacco: Never  Substance and Sexual Activity   Alcohol use: Yes   Drug use: No   Sexual activity: Yes    Birth control/protection: Post-menopausal  Other Topics Concern   Not on file  Social History Narrative   Not on file   Social Determinants of Health   Financial Resource Strain: Not on file  Food Insecurity: Not on file  Transportation Needs: Not on file  Physical Activity: Not on file  Stress: Not on file  Social Connections: Not on file  Intimate Partner Violence: Not on file      Levert Feinstein, M.D. Ph.D.  [facility] [address] [place], [place] [address] Ph: [phone] Fax: [phone]  CC:  Manning Charity, OD [address] [place],  [place] [address]  [name], MD

## 2023-02-08 ENCOUNTER — Encounter: Payer: Self-pay | Admitting: Family Medicine

## 2023-02-08 MED ORDER — GABAPENTIN 100 MG PO CAPS: 200.0000 mg | ORAL_CAPSULE | Freq: Every day | ORAL | 3 refills | Status: DC

## 2023-02-14 NOTE — Progress Notes (Signed)
[name] [address] [place] [address] Phone: [phone] Subjective:   [name], am serving as a scribe for Dr. Antoine Primas.  I'm seeing this patient by the request  of:  [name], MD  CC: Back and neck pain follow-up   [id number]  Dorothy [name] is a 67 y.o. female coming in with complaint of back and neck pain. OMT 11/21/2022. Patient states that her pain is worsening. Epidural 01/01/2023 did not work. Pain in the knee is increasing. Does not feel like gabapentin is helping.   Medications patient has been prescribed: Gabapentin  Taking:         Reviewed prior external information including notes and imaging from previsou exam, outside providers and external EMR if available.   As well as notes that were available from care everywhere and other healthcare systems.  Past medical history, social, surgical and family history all reviewed in electronic medical record.  No pertanent information unless stated regarding to the chief complaint.   No past medical history on file.  No Known Allergies   Review of Systems:  No headache, visual changes, nausea, vomiting, diarrhea, constipation, dizziness, abdominal pain, skin rash, fevers, chills, night sweats, weight loss, swollen lymph nodes, body aches, joint swelling, chest pain, shortness of breath, mood changes. POSITIVE muscle aches  Objective  Blood pressure 122/86, pulse 67, height 5\' 1"  (1.549 m), weight 117 lb (53.1 kg), last menstrual period 04/16/2008, SpO2 98%.   General: No apparent distress alert and oriented x3 mood and affect normal, dressed appropriately.  HEENT: Pupils equal, extraocular movements intact  Respiratory: Patient's speak in full sentences and does not appear short of breath  Cardiovascular: No lower extremity edema, non tender, no erythema  Gait MSK:  Back does have tightness noted with straight leg test noted.  Patient does have some  weakness actually with hip flexion and knee extension a little more than at her baseline.  Deep tendon reflexes are intact.  Patient's pain though is out of proportion.  Osteopathic findings  C2 flexed rotated and side bent right C7 flexed rotated and side bent left T3 extended rotated and side bent right inhaled rib T9 extended rotated and side bent left L2 flexed rotated and side bent right  L4 flexed rotated and side bent right Sacrum right on right       Assessment and Plan:  BACK PAIN, LUMBAR Concerned the patient is having more lumbar radiculopathy.  Patient's MRI is greater than 51 years old and 42-1/66 years old now.  Patient is not responding to the conservative therapies anymore including the different medications we have had patient on as well as the osteopathic manipulation.  Discussed with patient about icing regimen and home exercises, which activities to do and which ones to avoid.  Increase activity slowly.  Discussed icing regimen.  Follow-up again in 6 to 8 weeks.    Nonallopathic problems  Decision today to treat with OMT was based on Physical Exam  After verbal consent patient was treated with HVLA, ME, FPR techniques in cervical, rib, thoracic, lumbar, and sacral  areas  Patient tolerated the procedure well with improvement in symptoms  Patient given exercises, stretches and lifestyle modifications  See medications in patient instructions if given  Patient will follow up in 4-8 weeks     The above documentation has been reviewed and is accurate and complete Judi Saa, DO         Note: This  dictation was prepared with Dragon dictation along with smaller phrase technology. Any transcriptional errors that result from this process are unintentional.

## 2023-02-15 ENCOUNTER — Ambulatory Visit: Payer: Medicare HMO | Admitting: Family Medicine

## 2023-02-19 ENCOUNTER — Ambulatory Visit: Payer: Medicare HMO | Admitting: Family Medicine

## 2023-02-19 ENCOUNTER — Encounter: Payer: Self-pay | Admitting: Family Medicine

## 2023-02-19 VITALS — BP 122/86 | HR 67 | Ht 61.0 in | Wt 117.0 lb

## 2023-02-19 DIAGNOSIS — M9904 Segmental and somatic dysfunction of sacral region: Secondary | ICD-10-CM | POA: Diagnosis not present

## 2023-02-19 DIAGNOSIS — M9901 Segmental and somatic dysfunction of cervical region: Secondary | ICD-10-CM | POA: Diagnosis not present

## 2023-02-19 DIAGNOSIS — M9903 Segmental and somatic dysfunction of lumbar region: Secondary | ICD-10-CM

## 2023-02-19 DIAGNOSIS — M545 Low back pain, unspecified: Secondary | ICD-10-CM | POA: Diagnosis not present

## 2023-02-19 DIAGNOSIS — G8929 Other chronic pain: Secondary | ICD-10-CM | POA: Diagnosis not present

## 2023-02-19 DIAGNOSIS — M9908 Segmental and somatic dysfunction of rib cage: Secondary | ICD-10-CM | POA: Diagnosis not present

## 2023-02-19 DIAGNOSIS — M9902 Segmental and somatic dysfunction of thoracic region: Secondary | ICD-10-CM | POA: Diagnosis not present

## 2023-02-19 DIAGNOSIS — G4486 Cervicogenic headache: Secondary | ICD-10-CM | POA: Diagnosis not present

## 2023-02-19 MED ORDER — DULOXETINE HCL 20 MG PO CPEP: 20.0000 mg | ORAL_CAPSULE | Freq: Every day | ORAL | 0 refills | Status: DC

## 2023-02-19 NOTE — Patient Instructions (Signed)
Cymbalta 20mg   MRI [place] lumbar spine We will be in touch

## 2023-02-19 NOTE — Assessment & Plan Note (Signed)
Increase in headaches recently.  Given osteopathic manipulation with some improvement.  Started on Cymbalta which I think will be helpful for some of her other aches and pains as well.  Warned of potential side effects.  Discussed which activities to do and which ones to avoid.  Increase activity slowly.  Follow-up again in 6 to 8 weeks.

## 2023-02-19 NOTE — Assessment & Plan Note (Signed)
Concerned the patient is having more lumbar radiculopathy.  Patient's MRI is greater than 66 years old and 62-1/66 years old now.  Patient is not responding to the conservative therapies anymore including the different medications we have had patient on as well as the osteopathic manipulation.  Discussed with patient about icing regimen and home exercises, which activities to do and which ones to avoid.  Increase activity slowly.  Discussed icing regimen.  Follow-up again in 6 to 8 weeks.

## 2023-02-24 ENCOUNTER — Ambulatory Visit: Payer: Medicare HMO

## 2023-02-24 DIAGNOSIS — M5186 Other intervertebral disc disorders, lumbar region: Secondary | ICD-10-CM | POA: Diagnosis not present

## 2023-02-24 DIAGNOSIS — M545 Low back pain, unspecified: Secondary | ICD-10-CM

## 2023-02-24 DIAGNOSIS — M4316 Spondylolisthesis, lumbar region: Secondary | ICD-10-CM | POA: Diagnosis not present

## 2023-02-24 DIAGNOSIS — M4807 Spinal stenosis, lumbosacral region: Secondary | ICD-10-CM | POA: Diagnosis not present

## 2023-02-24 DIAGNOSIS — M47816 Spondylosis without myelopathy or radiculopathy, lumbar region: Secondary | ICD-10-CM | POA: Diagnosis not present

## 2023-02-24 DIAGNOSIS — M48061 Spinal stenosis, lumbar region without neurogenic claudication: Secondary | ICD-10-CM | POA: Diagnosis not present

## 2023-02-26 ENCOUNTER — Encounter: Payer: Self-pay | Admitting: Family Medicine

## 2023-03-06 ENCOUNTER — Other Ambulatory Visit: Payer: Self-pay

## 2023-03-06 ENCOUNTER — Encounter: Payer: Self-pay | Admitting: Family Medicine

## 2023-03-06 DIAGNOSIS — M5416 Radiculopathy, lumbar region: Secondary | ICD-10-CM

## 2023-03-13 ENCOUNTER — Other Ambulatory Visit: Payer: Self-pay | Admitting: Family Medicine

## 2023-03-18 ENCOUNTER — Encounter: Payer: Self-pay | Admitting: Family Medicine

## 2023-03-18 NOTE — Discharge Instructions (Signed)

## 2023-03-19 ENCOUNTER — Ambulatory Visit
Admission: RE | Admit: 2023-03-19 | Discharge: 2023-03-19 | Disposition: A | Discharge status: Home / Self Care | Payer: Medicare HMO | Source: Ambulatory Visit | Attending: Family Medicine | Admitting: Family Medicine

## 2023-03-19 DIAGNOSIS — M5416 Radiculopathy, lumbar region: Secondary | ICD-10-CM | POA: Diagnosis not present

## 2023-03-19 MED ORDER — IOPAMIDOL (ISOVUE-M 200) INJECTION 41%
1.0000 mL | Freq: Once | INTRAMUSCULAR | Status: AC
  Administered 2023-03-19: 1 mL via EPIDURAL

## 2023-03-19 MED ORDER — METHYLPREDNISOLONE ACETATE 40 MG/ML INJ SUSP (RADIOLOG
80.0000 mg | Freq: Once | INTRAMUSCULAR | Status: AC
  Administered 2023-03-19: 80 mg via EPIDURAL

## 2023-06-07 NOTE — Progress Notes (Signed)
  [name] [address] [place] [address] Phone: [phone] Subjective:   [name], am serving as a scribe for Dr. Antoine Primas.  I'm seeing this patient by the request  of:  [name], MD  CC: back and neck pain follow up   [id number]  Dorothy [name] is a 68 y.o. female coming in with complaint of back and neck pain. OMT on 02/19/2023. Patient states no big changes. No new symptoms. Pain returning.  Medications patient has been prescribed: cymbalta gabapentin  Taking:         Reviewed prior external information including notes and imaging from previsou exam, outside providers and external EMR if available.   As well as notes that were available from care everywhere and other healthcare systems.  Past medical history, social, surgical and family history all reviewed in electronic medical record.  No pertanent information unless stated regarding to the chief complaint.     Review of Systems:  No headache, visual changes, nausea, vomiting, diarrhea, constipation, dizziness, abdominal pain, skin rash, fevers, chills, night sweats, weight loss, swollen lymph nodes, body aches, joint swelling, chest pain, shortness of breath, mood changes. POSITIVE muscle aches  Objective  Blood pressure 116/66, pulse 66, height 5\' 1"  (1.549 m), last menstrual period 04/16/2008, SpO2 96%.   General: No apparent distress alert and oriented x3 mood and affect normal, dressed appropriately.  HEENT: Pupils equal, extraocular movements intact  Respiratory: Patient's speak in full sentences and does not appear short of breath  Cardiovascular: No lower extremity edema, non tender, no erythema  MSK:  Back loss of lordosis tightness in the TL junction seems to be the worst.  Tightness over the right sacroiliac joint. Patient does have some lateral tracking of the patella noted.  Mild crepitus noted.  No significant instability of the  knee  Osteopathic findings  C3 flexed rotated and side bent right C7 flexed rotated and side bent left T3 extended rotated and side bent right inhaled rib T7 extended rotated and side bent left L1 flexed rotated and side bent right L4 flexed rotated and side bent left Sacrum right on right     Assessment and Plan:  Cervicogenic headache Cervicogenic headaches noted patient has had some tightness noted.  Discussed which activities to do and which ones to avoid.  Will monitor patient's knee and we will treat more as a patellofemoral arthritic changes as well.  Follow-up again in 6 to 8 weeks.  Patellofemoral arthralgia of right knee Will monitor.  Has been sometime since we have done any injections.  Will consider if necessary.  Given a Tru pull lite brace today.    Nonallopathic problems  Decision today to treat with OMT was based on Physical Exam  After verbal consent patient was treated with HVLA, ME, FPR techniques in cervical, rib, thoracic, lumbar, and sacral  areas  Patient tolerated the procedure well with improvement in symptoms  Patient given exercises, stretches and lifestyle modifications  See medications in patient instructions if given  Patient will follow up in 4-8 weeks    The above documentation has been reviewed and is accurate and complete Judi Saa, DO          Note: This dictation was prepared with Dragon dictation along with smaller phrase technology. Any transcriptional errors that result from this process are unintentional.

## 2023-06-11 ENCOUNTER — Encounter: Payer: Self-pay | Admitting: Family Medicine

## 2023-06-11 ENCOUNTER — Ambulatory Visit: Payer: Medicare HMO | Admitting: Family Medicine

## 2023-06-11 VITALS — BP 116/66 | HR 66 | Ht 61.0 in

## 2023-06-11 DIAGNOSIS — M9901 Segmental and somatic dysfunction of cervical region: Secondary | ICD-10-CM

## 2023-06-11 DIAGNOSIS — G4486 Cervicogenic headache: Secondary | ICD-10-CM

## 2023-06-11 DIAGNOSIS — M25561 Pain in right knee: Secondary | ICD-10-CM | POA: Diagnosis not present

## 2023-06-11 DIAGNOSIS — M9903 Segmental and somatic dysfunction of lumbar region: Secondary | ICD-10-CM | POA: Diagnosis not present

## 2023-06-11 DIAGNOSIS — M9908 Segmental and somatic dysfunction of rib cage: Secondary | ICD-10-CM | POA: Diagnosis not present

## 2023-06-11 DIAGNOSIS — M9902 Segmental and somatic dysfunction of thoracic region: Secondary | ICD-10-CM | POA: Diagnosis not present

## 2023-06-11 DIAGNOSIS — M9904 Segmental and somatic dysfunction of sacral region: Secondary | ICD-10-CM | POA: Diagnosis not present

## 2023-06-11 NOTE — Assessment & Plan Note (Signed)
 Will monitor.  Has been sometime since we have done any injections.  Will consider if necessary.  Given a Tru pull lite brace today.

## 2023-06-11 NOTE — Patient Instructions (Addendum)
 You have 14 days to return or exchange your brace Call [phone], then return the brace to our office Keep monitoring the back Ice after piliates See you again in 6-8 weeks

## 2023-06-11 NOTE — Assessment & Plan Note (Signed)
 Cervicogenic headaches noted patient has had some tightness noted.  Discussed which activities to do and which ones to avoid.  Will monitor patient's knee and we will treat more as a patellofemoral arthritic changes as well.  Follow-up again in 6 to 8 weeks.

## 2023-06-18 ENCOUNTER — Ambulatory Visit: Payer: Medicare HMO | Admitting: Family Medicine

## 2023-06-21 ENCOUNTER — Encounter: Payer: Self-pay | Admitting: Family Medicine

## 2023-06-21 ENCOUNTER — Other Ambulatory Visit: Payer: Self-pay

## 2023-06-21 DIAGNOSIS — M5416 Radiculopathy, lumbar region: Secondary | ICD-10-CM

## 2023-06-23 ENCOUNTER — Other Ambulatory Visit: Payer: Self-pay | Admitting: Family Medicine

## 2023-06-24 ENCOUNTER — Encounter: Payer: Self-pay | Admitting: Family Medicine

## 2023-07-04 DIAGNOSIS — H04123 Dry eye syndrome of bilateral lacrimal glands: Secondary | ICD-10-CM | POA: Diagnosis not present

## 2023-07-04 DIAGNOSIS — H5213 Myopia, bilateral: Secondary | ICD-10-CM | POA: Diagnosis not present

## 2023-07-04 DIAGNOSIS — H52203 Unspecified astigmatism, bilateral: Secondary | ICD-10-CM | POA: Diagnosis not present

## 2023-07-04 DIAGNOSIS — H35363 Drusen (degenerative) of macula, bilateral: Secondary | ICD-10-CM | POA: Diagnosis not present

## 2023-07-04 NOTE — Discharge Instructions (Signed)

## 2023-07-05 ENCOUNTER — Ambulatory Visit
Admission: RE | Admit: 2023-07-05 | Discharge: 2023-07-05 | Disposition: A | Discharge status: Home / Self Care | Source: Ambulatory Visit | Attending: Family Medicine | Admitting: Family Medicine

## 2023-07-05 DIAGNOSIS — M4727 Other spondylosis with radiculopathy, lumbosacral region: Secondary | ICD-10-CM | POA: Diagnosis not present

## 2023-07-05 DIAGNOSIS — M5416 Radiculopathy, lumbar region: Secondary | ICD-10-CM

## 2023-07-05 MED ORDER — METHYLPREDNISOLONE ACETATE 40 MG/ML INJ SUSP (RADIOLOG
80.0000 mg | Freq: Once | INTRAMUSCULAR | Status: AC
  Administered 2023-07-05: 80 mg via EPIDURAL

## 2023-07-05 MED ORDER — IOPAMIDOL (ISOVUE-M 200) INJECTION 41%
1.0000 mL | Freq: Once | INTRAMUSCULAR | Status: AC
  Administered 2023-07-05: 1 mL via EPIDURAL

## 2023-07-08 DIAGNOSIS — Z1231 Encounter for screening mammogram for malignant neoplasm of breast: Secondary | ICD-10-CM | POA: Diagnosis not present

## 2023-07-16 NOTE — Progress Notes (Signed)
 [name] [address] [place] [address] Phone: [phone] Subjective:   [name] am a scribe for Dr. Katrinka Blazing.   I'm seeing this patient by the request  of:  [name], MD  CC: Back and neck pain follow-up  [id number]  Dorothy [name] is a 68 y.o. female coming in with complaint of back and neck pain. OMT on 06/11/2023. Patient states having headache. When she wakes up it starts and stays. Had an injection a couple of weeks ago but no relief.   Medications patient has been prescribed: Gabapentin  Taking:         Reviewed prior external information including notes and imaging from previsou exam, outside providers and external EMR if available.   As well as notes that were available from care everywhere and other healthcare systems.  Past medical history, social, surgical and family history all reviewed in electronic medical record.  No pertanent information unless stated regarding to the chief complaint.   No past medical history on file.  No Known Allergies   Review of Systems:  No headache, visual changes, nausea, vomiting, diarrhea, constipation, dizziness, abdominal pain, skin rash, fevers, chills, night sweats, weight loss, swollen lymph nodes, body aches, joint swelling, chest pain, shortness of breath, mood changes. POSITIVE muscle aches  Objective  Blood pressure 122/70, pulse 64, height 5\' 1"  (1.549 m), last menstrual period 04/16/2008, SpO2 95%.   General: No apparent distress alert and oriented x3 mood and affect normal, dressed appropriately.  HEENT: Pupils equal, extraocular movements intact  Respiratory: Patient's speak in full sentences and does not appear short of breath  Cardiovascular: No lower extremity edema, non tender, no erythema  Gait MSK:  Back does have significant loss of lordosis noted.  Tenderness to palpation noted.  Osteopathic findings  C2 flexed rotated and side bent right C6  flexed rotated and side bent left T3 extended rotated and side bent left inhaled rib T9 extended rotated and side bent left L2 flexed rotated and side bent right L5 flexed rotated and side bent left Sacrum right on right       Assessment and Plan:  Spinal stenosis, lumbar region, with neurogenic claudication Patient does have spinal stenosis noted with neurogenic claudication somewhat.  The patient's pain is worse with certain activities such as going uphill.  We discussed with patient that some of it is also the facet arthritis but is not responded well to that as well.  Patient has failed all conservative therapy including medications.  Anti-inflammatories do allow her to do daily activities.  Patient will need to continue to be extremely active.  Will refer her to neurosurgery to discuss other treatment options under patient's request.  Cervicogenic headache Continues to have multifactorial in the headaches.  Recently did lose her husband.  Is helping send Lollie Sails sounds like he has a lot of.  Lots of different stress noted today.  Discussed icing regimen and home exercises.  Follow-up again in 6 to 8 weeks.    Nonallopathic problems  Decision today to treat with OMT was based on Physical Exam  After verbal consent patient was treated with HVLA, ME, FPR techniques in cervical, rib, thoracic, lumbar, and sacral  areas  Patient tolerated the procedure well with improvement in symptoms  Patient given exercises, stretches and lifestyle modifications  See medications in patient instructions if given  Patient will follow up in 4-8 weeks  Note: This dictation was prepared with Dragon dictation along with smaller phrase technology. Any transcriptional errors that result from this process are unintentional.

## 2023-07-18 DIAGNOSIS — H1789 Other corneal scars and opacities: Secondary | ICD-10-CM | POA: Diagnosis not present

## 2023-07-18 DIAGNOSIS — H18593 Other hereditary corneal dystrophies, bilateral: Secondary | ICD-10-CM | POA: Diagnosis not present

## 2023-07-23 ENCOUNTER — Ambulatory Visit: Payer: Medicare HMO | Admitting: Family Medicine

## 2023-07-23 ENCOUNTER — Encounter: Payer: Self-pay | Admitting: Family Medicine

## 2023-07-23 VITALS — BP 122/70 | HR 64 | Ht 61.0 in

## 2023-07-23 DIAGNOSIS — M48062 Spinal stenosis, lumbar region with neurogenic claudication: Secondary | ICD-10-CM | POA: Diagnosis not present

## 2023-07-23 DIAGNOSIS — M9908 Segmental and somatic dysfunction of rib cage: Secondary | ICD-10-CM | POA: Diagnosis not present

## 2023-07-23 DIAGNOSIS — G4486 Cervicogenic headache: Secondary | ICD-10-CM | POA: Diagnosis not present

## 2023-07-23 DIAGNOSIS — M9901 Segmental and somatic dysfunction of cervical region: Secondary | ICD-10-CM | POA: Diagnosis not present

## 2023-07-23 DIAGNOSIS — M9902 Segmental and somatic dysfunction of thoracic region: Secondary | ICD-10-CM

## 2023-07-23 DIAGNOSIS — M9903 Segmental and somatic dysfunction of lumbar region: Secondary | ICD-10-CM

## 2023-07-23 DIAGNOSIS — M545 Low back pain, unspecified: Secondary | ICD-10-CM | POA: Diagnosis not present

## 2023-07-23 DIAGNOSIS — M9904 Segmental and somatic dysfunction of sacral region: Secondary | ICD-10-CM

## 2023-07-23 NOTE — Assessment & Plan Note (Signed)
 Patient does have spinal stenosis noted with neurogenic claudication somewhat.  The patient's pain is worse with certain activities such as going uphill.  We discussed with patient that some of it is also the facet arthritis but is not responded well to that as well.  Patient has failed all conservative therapy including medications.  Anti-inflammatories do allow her to do daily activities.  Patient will need to continue to be extremely active.  Will refer her to neurosurgery to discuss other treatment options under patient's request.

## 2023-07-23 NOTE — Patient Instructions (Signed)
 Refer to neuro surgery to discuss other options.  Keep Darl Pikes from getting arrested. Return in 6 weeks.

## 2023-07-23 NOTE — Assessment & Plan Note (Addendum)
 Continues to have multifactorial in the headaches.  Recently did lose her husband.  Is helping send Lollie Sails sounds like he has a lot of.  Lots of different stress noted today.  Discussed icing regimen and home exercises.  Follow-up again in 6 to 8 weeks.  Discussed increasing gabapentin to 300 mg at night.

## 2023-08-02 DIAGNOSIS — Z0100 Encounter for examination of eyes and vision without abnormal findings: Secondary | ICD-10-CM | POA: Diagnosis not present

## 2023-09-03 DIAGNOSIS — M4317 Spondylolisthesis, lumbosacral region: Secondary | ICD-10-CM | POA: Diagnosis not present

## 2023-09-10 NOTE — Progress Notes (Unsigned)
 [facility] [address] [place] [address] Phone: [phone] Subjective:   IBryan Caprio, am serving as a scribe for Dr. Ronnell Coins.  I'm seeing this patient by the request  of:  Helyn Lobstein, MD  CC: Low back pain follow-up  [id number]  ASHLEEN DEMMA is a 68 y.o. female coming in with complaint of back and neck pain. OMT 07/23/2023. Patient states wants to talk about neuro visit and the suggested surgery. No other concerns or symptoms.  Medications patient has been prescribed: Gabapentin   Taking:  Last epidural was 2 months ago.    When reviewing patient's chart he was seen by neurosurgery on May 20 but did not have notes noted.   Reviewed prior external information including notes and imaging from previsou exam, outside providers and external EMR if available.   As well as notes that were available from care everywhere and other healthcare systems.  Past medical history, social, surgical and family history all reviewed in electronic medical record.  No pertanent information unless stated regarding to the chief complaint.   No past medical history on file.  No Known Allergies   Review of Systems:  No headache, visual changes, nausea, vomiting, diarrhea, constipation, dizziness, abdominal pain, skin rash, fevers, chills, night sweats, weight loss, swollen lymph nodes, body aches, joint swelling, chest pain, shortness of breath, mood changes. POSITIVE muscle aches  Objective  Blood pressure 108/74, pulse (!) 59, height 5\' 1"  (1.549 m), weight 114 lb (51.7 kg), last menstrual period 04/16/2008, SpO2 97%.   General: No apparent distress alert and oriented x3 mood and affect normal, dressed appropriately.  HEENT: Pupils equal, extraocular movements intact  Respiratory: Patient's speak in full sentences and does not appear short of breath  Cardiovascular: No lower extremity edema, non tender, no erythema  Gait relatively  normal some difficulty going from a seated to standing position are noted. MSK:  Back low back does have some loss lordosis.  Some tenderness to palpation diffusely but does seem to be worse right greater than left at the moment.  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  Is planning on likely having a lumbar fusion with neurosurgery at L4-L5 in the relatively near future.  Would like to see her before surgery 1 more time for potential manipulation if she needs it.  Discussed icing regimen and home exercises, which activities to do with which ones to avoid.  Has medications including muscle relaxers as well as gabapentin  that prescribed by me and will see side effects.  Discussed which activities to do and which ones to avoid.  Follow-up again in 6 to 8 weeks otherwise. Duloxetine  and gabapentin  continued.  Nonallopathic problems  Decision today to treat with OMT was based on Physical Exam  After verbal consent patient was treated with  ME, FPR techniques in cervical, rib, thoracic, lumbar, and sacral  areas  Patient tolerated the procedure well with improvement in symptoms  Patient given exercises, stretches and lifestyle modifications  See medications in patient instructions if given  Patient will follow up in 4-8 weeks  The above documentation has been reviewed and is accurate and complete Charlene Detter M Kendrew Paci, DO         Note: This dictation was prepared with Dragon dictation along with smaller phrase technology. Any transcriptional errors that result from this process are unintentional.

## 2023-09-11 ENCOUNTER — Ambulatory Visit: Admitting: Family Medicine

## 2023-09-11 ENCOUNTER — Encounter: Payer: Self-pay | Admitting: Family Medicine

## 2023-09-11 VITALS — BP 108/74 | HR 59 | Ht 61.0 in | Wt 114.0 lb

## 2023-09-11 DIAGNOSIS — M9904 Segmental and somatic dysfunction of sacral region: Secondary | ICD-10-CM | POA: Diagnosis not present

## 2023-09-11 DIAGNOSIS — M9901 Segmental and somatic dysfunction of cervical region: Secondary | ICD-10-CM

## 2023-09-11 DIAGNOSIS — M9903 Segmental and somatic dysfunction of lumbar region: Secondary | ICD-10-CM | POA: Diagnosis not present

## 2023-09-11 DIAGNOSIS — M4317 Spondylolisthesis, lumbosacral region: Secondary | ICD-10-CM | POA: Diagnosis not present

## 2023-09-11 DIAGNOSIS — M9908 Segmental and somatic dysfunction of rib cage: Secondary | ICD-10-CM

## 2023-09-11 DIAGNOSIS — M9902 Segmental and somatic dysfunction of thoracic region: Secondary | ICD-10-CM | POA: Diagnosis not present

## 2023-09-11 DIAGNOSIS — M48062 Spinal stenosis, lumbar region with neurogenic claudication: Secondary | ICD-10-CM

## 2023-09-11 NOTE — Patient Instructions (Signed)
 I think you're making the right decision Good to see you! See you again in 5 weeks

## 2023-09-11 NOTE — Assessment & Plan Note (Signed)
 Spinal stenosis with spondylolisthesis.  Is planning on likely having a lumbar fusion with neurosurgery at L4-L5 in the relatively near future.  Would like to see her before surgery 1 more time for potential manipulation if she needs it.  Discussed icing regimen and home exercises, which activities to do with which ones to avoid.  Has medications including muscle relaxers as well as gabapentin  that prescribed by me and will see side effects.  Discussed which activities to do and which ones to avoid.  Follow-up again in 6 to 8 weeks otherwise.

## 2023-09-13 ENCOUNTER — Other Ambulatory Visit: Payer: Self-pay | Admitting: Neurosurgery

## 2023-09-17 DIAGNOSIS — M4317 Spondylolisthesis, lumbosacral region: Secondary | ICD-10-CM | POA: Diagnosis not present

## 2023-09-24 DIAGNOSIS — M4317 Spondylolisthesis, lumbosacral region: Secondary | ICD-10-CM | POA: Diagnosis not present

## 2023-10-03 DIAGNOSIS — M4317 Spondylolisthesis, lumbosacral region: Secondary | ICD-10-CM | POA: Diagnosis not present

## 2023-10-14 NOTE — Progress Notes (Signed)
 Surgical Instructions   Your procedure is scheduled on Wednesday July 16. Report to [facility] Main Entrance A at 7:30 A.M., then check in with the Admitting office. Any questions or running late day of surgery: call [phone]  Questions prior to your surgery date: call [phone], Monday-Friday, 8am-4pm. If you experience any cold or flu symptoms such as cough, fever, chills, shortness of breath, etc. between now and your scheduled surgery, please notify us  at the above number.     Remember:  Do not eat or drink anything after midnight the night before your surgery   Take these medicines the morning of surgery with A SIP OF WATER: none  May take these medicines IF NEEDED: fluticasone (FLONASE)   One week prior to surgery, STOP taking any Aspirin (unless otherwise instructed by your surgeon) Aleve, Naproxen, Ibuprofen, Motrin, Advil, Goody's, BC's, all herbal medications, fish oil, and non-prescription vitamins.                     Do NOT Smoke (Tobacco/Vaping) for 24 hours prior to your procedure.  If you use a CPAP at night, you may bring your mask/headgear for your overnight stay.   You will be asked to remove any contacts, glasses, piercing's, hearing aid's, dentures/partials prior to surgery. Please bring cases for these items if needed.    Patients discharged the day of surgery will not be allowed to drive home, and someone needs to stay with them for 24 hours.  SURGICAL WAITING ROOM VISITATION Patients may have no more than 2 support people in the waiting area - these visitors may rotate.   Pre-op nurse will coordinate an appropriate time for 1 ADULT support person, who may not rotate, to accompany patient in pre-op.  Children under the age of 63 must have an adult with them who is not the patient and must remain in the main waiting area with an adult.  If the patient needs to stay at the hospital during part of their recovery, the visitor guidelines for inpatient  rooms apply.  Please refer to the [facility] website for the visitor guidelines for any additional information.   If you received a COVID test during your pre-op visit  it is requested that you wear a mask when out in public, stay away from anyone that may not be feeling well and notify your surgeon if you develop symptoms. If you have been in contact with anyone that has tested positive in the last 10 days please notify you surgeon.      Pre-operative 5 CHG Bathing Instructions   You can play a key role in reducing the risk of infection after surgery. Your skin needs to be as free of germs as possible. You can reduce the number of germs on your skin by washing with CHG (chlorhexidine gluconate) soap before surgery. CHG is an antiseptic soap that kills germs and continues to kill germs even after washing.   DO NOT use if you have an allergy to chlorhexidine/CHG or antibacterial soaps. If your skin becomes reddened or irritated, stop using the CHG and notify one of our RNs at [phone].   Please shower with the CHG soap starting 4 days before surgery using the following schedule:     Please keep in mind the following:  DO NOT shave, including legs and underarms, starting the day of your first shower.   You may shave your face at any point before/day of surgery.  Place clean sheets on your  bed the day you start using CHG soap. Use a clean washcloth (not used since being washed) for each shower. DO NOT sleep with pets once you start using the CHG.   CHG Shower Instructions:  Wash your face and private area with normal soap. If you choose to wash your hair, wash first with your normal shampoo.  After you use shampoo/soap, rinse your hair and body thoroughly to remove shampoo/soap residue.  Turn the water OFF and apply about 3 tablespoons (45 ml) of CHG soap to a CLEAN washcloth.  Apply CHG soap ONLY FROM YOUR NECK DOWN TO YOUR TOES (washing for 3-5 minutes)  DO NOT use CHG soap on  face, private areas, open wounds, or sores.  Pay special attention to the area where your surgery is being performed.  If you are having back surgery, having someone wash your back for you may be helpful. Wait 2 minutes after CHG soap is applied, then you may rinse off the CHG soap.  Pat dry with a clean towel  Put on clean clothes/pajamas   If you choose to wear lotion, please use ONLY the CHG-compatible lotions that are listed below.  Additional instructions for the day of surgery: DO NOT APPLY any lotions, deodorants, cologne, or perfumes.   Do not bring valuables to the hospital. The [facility] is not responsible for any belongings/valuables. Do not wear nail polish, gel polish, artificial nails, or any other type of covering on natural nails (fingers and toes) Do not wear jewelry or makeup Put on clean/comfortable clothes.  Please brush your teeth.  Ask your nurse before applying any prescription medications to the skin.     CHG Compatible Lotions   Aveeno Moisturizing lotion  Cetaphil Moisturizing Cream  Cetaphil Moisturizing Lotion  Clairol Herbal Essence Moisturizing Lotion, Dry Skin  Clairol Herbal Essence Moisturizing Lotion, Extra Dry Skin  Clairol Herbal Essence Moisturizing Lotion, Normal Skin  Curel Age Defying Therapeutic Moisturizing Lotion with Alpha Hydroxy  Curel Extreme Care Body Lotion  Curel Soothing Hands Moisturizing Hand Lotion  Curel Therapeutic Moisturizing Cream, Fragrance-Free  Curel Therapeutic Moisturizing Lotion, Fragrance-Free  Curel Therapeutic Moisturizing Lotion, Original Formula  Eucerin Daily Replenishing Lotion  Eucerin Dry Skin Therapy Plus Alpha Hydroxy Crme  Eucerin Dry Skin Therapy Plus Alpha Hydroxy Lotion  Eucerin Original Crme  Eucerin Original Lotion  Eucerin Plus Crme Eucerin Plus Lotion  Eucerin TriLipid Replenishing Lotion  Keri Anti-Bacterial Hand Lotion  Keri Deep Conditioning Original Lotion Dry Skin Formula Softly  Scented  Keri Deep Conditioning Original Lotion, Fragrance Free Sensitive Skin Formula  Keri Lotion Fast Absorbing Fragrance Free Sensitive Skin Formula  Keri Lotion Fast Absorbing Softly Scented Dry Skin Formula  Keri Original Lotion  Keri Skin Renewal Lotion Keri Silky Smooth Lotion  Keri Silky Smooth Sensitive Skin Lotion  Nivea Body Creamy Conditioning Oil  Nivea Body Extra Enriched Lotion  Nivea Body Original Lotion  Nivea Body Sheer Moisturizing Lotion Nivea Crme  Nivea Skin Firming Lotion  NutraDerm 30 Skin Lotion  NutraDerm Skin Lotion  NutraDerm Therapeutic Skin Cream  NutraDerm Therapeutic Skin Lotion  ProShield Protective Hand Cream  Provon moisturizing lotion  Please read over the following fact sheets that you were given.

## 2023-10-15 ENCOUNTER — Other Ambulatory Visit: Payer: Self-pay

## 2023-10-15 ENCOUNTER — Encounter (HOSPITAL_COMMUNITY)
Admission: RE | Admit: 2023-10-15 | Discharge: 2023-10-15 | Disposition: A | Discharge status: Home / Self Care | Source: Ambulatory Visit | Attending: Neurosurgery | Admitting: Neurosurgery

## 2023-10-15 ENCOUNTER — Encounter (HOSPITAL_COMMUNITY): Payer: Self-pay

## 2023-10-15 VITALS — BP 139/77 | HR 59 | Temp 98.5°F | Resp 16 | Ht 61.0 in | Wt 115.9 lb

## 2023-10-15 DIAGNOSIS — Z01812 Encounter for preprocedural laboratory examination: Secondary | ICD-10-CM | POA: Insufficient documentation

## 2023-10-15 DIAGNOSIS — Z01818 Encounter for other preprocedural examination: Secondary | ICD-10-CM

## 2023-10-15 HISTORY — DX: Other specified postprocedural states: Z98.890

## 2023-10-15 HISTORY — DX: Other complications of anesthesia, initial encounter: T88.59XA

## 2023-10-15 LAB — CBC
HCT: 42.5 % (ref 36.0–46.0)
Hemoglobin: 14.3 g/dL (ref 12.0–15.0)
MCH: 32.2 pg (ref 26.0–34.0)
MCHC: 33.6 g/dL (ref 30.0–36.0)
MCV: 95.7 fL (ref 80.0–100.0)
Platelets: 277 10*3/uL (ref 150–400)
RBC: 4.44 MIL/uL (ref 3.87–5.11)
RDW: 11.7 % (ref 11.5–15.5)
WBC: 4.6 10*3/uL (ref 4.0–10.5)
nRBC: 0 % (ref 0.0–0.2)

## 2023-10-15 LAB — ABO/RH: ABO/RH(D): O NEG

## 2023-10-15 LAB — SURGICAL PCR SCREEN
MRSA, PCR: NEGATIVE
Staphylococcus aureus: NEGATIVE

## 2023-10-15 NOTE — Progress Notes (Signed)
 PCP - Sari Aisha COME Cardiologist - denies Sports medicine - Zack Smith,MD  PPM/ICD - denies Device Orders -  Rep Notified -   Chest x-ray - na EKG - na Stress Test - denies ECHO - denies Cardiac Cath - denies  Sleep Study -denies  CPAP -   Fasting Blood Sugar - na Checks Blood Sugar _____ times a day  Last dose of GLP1 agonist-  na GLP1 instructions:   Blood Thinner Instructions:na Aspirin Instructions:na  ERAS Protcol -no PRE-SURGERY Ensure or G2-   COVID TEST- na   Anesthesia review: na  Patient denies shortness of breath, fever, cough and chest pain at PAT appointment   All instructions explained to the patient, with a verbal understanding of the material. Patient agrees to go over the instructions while at home for a better understanding. The opportunity to ask questions was provided.

## 2023-10-15 NOTE — Progress Notes (Unsigned)
 [name] [address] [place] [address] Phone: [phone] Subjective:   Dorothy [name], am serving as a scribe for Dr. Arthea Claudene.  I'm seeing this patient by the request  of:  Dorothy Harvey, MD  CC: Neck and back pain follow-up  YEP:[id number]  Dorothy [name] is a 68 y.o. female coming in with complaint of back and neck pain. OMT 09/11/2023. Lumbar fusion 10/30/2023. Patient states having pain every day.  Tightness in the lower back but more pain in the right leg.  Affecting daily activities as well as her golf game.  Patient states that the medications are no longer working.  Does feel that she gets some respite from manipulation.  Medications patient has been prescribed: Gabapentin   Taking: Yes         Reviewed prior external information including notes and imaging from previsou exam, outside providers and external EMR if available.   As well as notes that were available from care everywhere and other healthcare systems.  Past medical history, social, surgical and family history all reviewed in electronic medical record.  No pertanent information unless stated regarding to the chief complaint.   Past Medical History:  Diagnosis Date   Complication of anesthesia    PONV (postoperative nausea and vomiting)     No Known Allergies   Review of Systems:  No headache, visual changes, nausea, vomiting, diarrhea, constipation, dizziness, abdominal pain, skin rash, fevers, chills, night sweats, weight loss, swollen lymph nodes, body aches, joint swelling, chest pain, shortness of breath, mood changes. POSITIVE muscle aches  Objective  Blood pressure 122/78, pulse 75, height 5' 1 (1.549 m), last menstrual period 04/16/2008, SpO2 99%.   General: No apparent distress alert and oriented x3 mood and affect normal, dressed appropriately.  HEENT: Pupils equal, extraocular movements intact  Respiratory: Patient's speak in full sentences  and does not appear short of breath  Cardiovascular: No lower extremity edema, non tender, no erythema  Gait MSK:  Back does have some loss lordosis.  Some tightness noted in the paraspinal musculature.  Patient has negative's weakness in the leg but unfortunately positive straight leg test down the right leg at 20 degrees of forward flexion.  Osteopathic findings  C2 flexed rotated and side bent right T3 extended rotated and side bent right inhaled rib T9 extended rotated and side bent left L2 flexed rotated and side bent right L3 flexed rotated and side bent left Sacrum right on right       Assessment and Plan:  Spinal stenosis, lumbar region, with neurogenic claudication Known spinal stenosis and is going to have fusion.  Attempted muscle energy technique to try to get some loosened up.  Discussed with patient about what to expect in the post surgical aspect and how to advance accordingly.  Patient will be doing formal physical therapy afterwards I am sure.  Patient is going to have 1 more golf trip before she has the surgery.  Can follow-up with me when released from surgery    Nonallopathic problems  Decision today to treat with OMT was based on Physical Exam  After verbal consent patient was treated with  ME, FPR techniques in cervical, rib, thoracic, lumbar, and sacral  areas  Patient tolerated the procedure well with improvement in symptoms  Patient given exercises, stretches and lifestyle modifications  See medications in patient instructions if given  Patient will follow up in 4-8 weeks    The above documentation has been reviewed  and is accurate and complete Dorothy [name] M Dorothy Menken, DO          Note: This dictation was prepared with Dragon dictation along with smaller phrase technology. Any transcriptional errors that result from this process are unintentional.

## 2023-10-16 ENCOUNTER — Encounter: Payer: Self-pay | Admitting: Family Medicine

## 2023-10-16 ENCOUNTER — Ambulatory Visit: Admitting: Family Medicine

## 2023-10-16 VITALS — BP 122/78 | HR 75 | Ht 61.0 in

## 2023-10-16 DIAGNOSIS — M9903 Segmental and somatic dysfunction of lumbar region: Secondary | ICD-10-CM

## 2023-10-16 DIAGNOSIS — M9902 Segmental and somatic dysfunction of thoracic region: Secondary | ICD-10-CM

## 2023-10-16 DIAGNOSIS — M9908 Segmental and somatic dysfunction of rib cage: Secondary | ICD-10-CM | POA: Diagnosis not present

## 2023-10-16 DIAGNOSIS — M9904 Segmental and somatic dysfunction of sacral region: Secondary | ICD-10-CM | POA: Diagnosis not present

## 2023-10-16 DIAGNOSIS — M9901 Segmental and somatic dysfunction of cervical region: Secondary | ICD-10-CM

## 2023-10-16 DIAGNOSIS — M48062 Spinal stenosis, lumbar region with neurogenic claudication: Secondary | ICD-10-CM | POA: Diagnosis not present

## 2023-10-16 DIAGNOSIS — M4317 Spondylolisthesis, lumbosacral region: Secondary | ICD-10-CM | POA: Diagnosis not present

## 2023-10-16 NOTE — Assessment & Plan Note (Signed)
 Known spinal stenosis and is going to have fusion.  Attempted muscle energy technique to try to get some loosened up.  Discussed with patient about what to expect in the post surgical aspect and how to advance accordingly.  Patient will be doing formal physical therapy afterwards I am sure.  Patient is going to have 1 more golf trip before she has the surgery.  Can follow-up with me when released from surgery

## 2023-10-28 DIAGNOSIS — E7800 Pure hypercholesterolemia, unspecified: Secondary | ICD-10-CM | POA: Diagnosis not present

## 2023-10-28 DIAGNOSIS — Z6821 Body mass index (BMI) 21.0-21.9, adult: Secondary | ICD-10-CM | POA: Diagnosis not present

## 2023-10-28 DIAGNOSIS — Z0000 Encounter for general adult medical examination without abnormal findings: Secondary | ICD-10-CM | POA: Diagnosis not present

## 2023-10-28 DIAGNOSIS — Z9189 Other specified personal risk factors, not elsewhere classified: Secondary | ICD-10-CM | POA: Diagnosis not present

## 2023-10-28 DIAGNOSIS — M8588 Other specified disorders of bone density and structure, other site: Secondary | ICD-10-CM | POA: Diagnosis not present

## 2023-10-28 DIAGNOSIS — M4316 Spondylolisthesis, lumbar region: Secondary | ICD-10-CM | POA: Diagnosis not present

## 2023-10-29 ENCOUNTER — Other Ambulatory Visit: Payer: Self-pay | Admitting: Family Medicine

## 2023-10-29 NOTE — Anesthesia Preprocedure Evaluation (Signed)
 Anesthesia Evaluation  Patient identified by MRN, date of birth, ID band Patient awake    Reviewed: Allergy & Precautions, NPO status , Patient's Chart, lab work & pertinent test results  History of Anesthesia Complications (+) PONV and history of anesthetic complications  Airway Mallampati: I  TM Distance: >3 FB Neck ROM: Full    Dental  (+) Dental Advisory Given   Pulmonary neg pulmonary ROS   Pulmonary exam normal breath sounds clear to auscultation       Cardiovascular negative cardio ROS  Rhythm:Regular Rate:Normal     Neuro/Psych  Headaches, neg Seizures  Neuromuscular disease (lumbar spinal stenosis)    GI/Hepatic negative GI ROS, Neg liver ROS,,,  Endo/Other  negative endocrine ROS    Renal/GU negative Renal ROS     Musculoskeletal   Abdominal   Peds  Hematology negative hematology ROS (+) Lab Results      Component                Value               Date                      WBC                      4.6                 10/15/2023                HGB                      14.3                10/15/2023                HCT                      42.5                10/15/2023                MCV                      95.7                10/15/2023                PLT                      277                 10/15/2023              Anesthesia Other Findings   Reproductive/Obstetrics                              Anesthesia Physical Anesthesia Plan  ASA: 2  Anesthesia Plan: General   Post-op Pain Management: Tylenol  PO (pre-op)*   Induction: Intravenous  PONV Risk Score and Plan: 4 or greater and Ondansetron , Dexamethasone , Midazolam  and Treatment may vary due to age or medical condition  Airway Management Planned: Oral ETT  Additional Equipment:   Intra-op Plan:   Post-operative Plan: Extubation in OR  Informed Consent: I have reviewed the patients History and Physical,  chart, labs and discussed the procedure including the  risks, benefits and alternatives for the proposed anesthesia with the patient or authorized representative who has indicated his/her understanding and acceptance.     Dental advisory given  Plan Discussed with: CRNA and Anesthesiologist  Anesthesia Plan Comments: (Risks of general anesthesia discussed including, but not limited to, sore throat, hoarse voice, chipped/damaged teeth, injury to vocal cords, nausea and vomiting, allergic reactions, lung infection, heart attack, stroke, and death. All questions answered. )         Anesthesia Quick Evaluation

## 2023-10-30 ENCOUNTER — Ambulatory Visit (HOSPITAL_COMMUNITY)
Admission: RE | Admit: 2023-10-30 | Discharge: 2023-10-31 | Disposition: A | Discharge status: Home / Self Care | Source: Ambulatory Visit | Attending: Neurosurgery | Admitting: Neurosurgery

## 2023-10-30 ENCOUNTER — Other Ambulatory Visit: Payer: Self-pay

## 2023-10-30 ENCOUNTER — Encounter (HOSPITAL_COMMUNITY): Payer: Self-pay | Admitting: Neurosurgery

## 2023-10-30 ENCOUNTER — Ambulatory Visit (HOSPITAL_COMMUNITY)

## 2023-10-30 ENCOUNTER — Encounter (HOSPITAL_COMMUNITY)
Admission: RE | Disposition: A | Discharge status: Home / Self Care | Payer: Self-pay | Source: Ambulatory Visit | Attending: Neurosurgery

## 2023-10-30 ENCOUNTER — Ambulatory Visit (HOSPITAL_COMMUNITY): Payer: Self-pay | Admitting: Anesthesiology

## 2023-10-30 DIAGNOSIS — M5416 Radiculopathy, lumbar region: Secondary | ICD-10-CM | POA: Diagnosis not present

## 2023-10-30 DIAGNOSIS — M4316 Spondylolisthesis, lumbar region: Secondary | ICD-10-CM

## 2023-10-30 DIAGNOSIS — M4317 Spondylolisthesis, lumbosacral region: Secondary | ICD-10-CM | POA: Diagnosis not present

## 2023-10-30 DIAGNOSIS — M532X6 Spinal instabilities, lumbar region: Secondary | ICD-10-CM | POA: Diagnosis not present

## 2023-10-30 DIAGNOSIS — M48061 Spinal stenosis, lumbar region without neurogenic claudication: Secondary | ICD-10-CM

## 2023-10-30 DIAGNOSIS — Z0189 Encounter for other specified special examinations: Secondary | ICD-10-CM | POA: Diagnosis not present

## 2023-10-30 LAB — TYPE AND SCREEN
ABO/RH(D): O NEG
Antibody Screen: NEGATIVE

## 2023-10-30 SURGERY — POSTERIOR LUMBAR FUSION 1 LEVEL
Anesthesia: General | Site: Back

## 2023-10-30 MED ORDER — BUPIVACAINE LIPOSOME 1.3 % IJ SUSP
INTRAMUSCULAR | Status: AC
  Filled 2023-10-30: qty 20

## 2023-10-30 MED ORDER — PANTOPRAZOLE SODIUM 40 MG PO TBEC
40.0000 mg | DELAYED_RELEASE_TABLET | Freq: Every day | ORAL | Status: DC
  Administered 2023-10-30: 40 mg via ORAL
  Filled 2023-10-30: qty 1

## 2023-10-30 MED ORDER — ONDANSETRON HCL 4 MG/2ML IJ SOLN
INTRAMUSCULAR | Status: DC | PRN
  Administered 2023-10-30: 4 mg via INTRAVENOUS

## 2023-10-30 MED ORDER — AMISULPRIDE (ANTIEMETIC) 5 MG/2ML IV SOLN: 10.0000 mg | Freq: Once | INTRAVENOUS | Status: DC | PRN

## 2023-10-30 MED ORDER — 0.9 % SODIUM CHLORIDE (POUR BTL) OPTIME
TOPICAL | Status: DC | PRN
  Administered 2023-10-30: 1000 mL

## 2023-10-30 MED ORDER — BUPIVACAINE LIPOSOME 1.3 % IJ SUSP
INTRAMUSCULAR | Status: DC | PRN
  Administered 2023-10-30: 20 mL

## 2023-10-30 MED ORDER — SCOPOLAMINE 1 MG/3DAYS TD PT72
MEDICATED_PATCH | TRANSDERMAL | Status: DC | PRN
  Administered 2023-10-30: 1 via TRANSDERMAL

## 2023-10-30 MED ORDER — PHENOL 1.4 % MT LIQD: 1.0000 | OROMUCOSAL | Status: DC | PRN

## 2023-10-30 MED ORDER — SODIUM CHLORIDE 0.9% FLUSH
3.0000 mL | Freq: Two times a day (BID) | INTRAVENOUS | Status: DC
  Administered 2023-10-30 (×2): 3 mL via INTRAVENOUS

## 2023-10-30 MED ORDER — FENTANYL CITRATE (PF) 250 MCG/5ML IJ SOLN
INTRAMUSCULAR | Status: AC
  Filled 2023-10-30: qty 5

## 2023-10-30 MED ORDER — OXYCODONE HCL 5 MG/5ML PO SOLN: 5.0000 mg | Freq: Once | ORAL | Status: DC | PRN

## 2023-10-30 MED ORDER — HYDROMORPHONE HCL 1 MG/ML IJ SOLN: 0.5000 mg | INTRAMUSCULAR | Status: DC | PRN

## 2023-10-30 MED ORDER — FENTANYL CITRATE (PF) 100 MCG/2ML IJ SOLN
INTRAMUSCULAR | Status: AC
  Filled 2023-10-30: qty 2

## 2023-10-30 MED ORDER — HYDROCODONE-ACETAMINOPHEN 5-325 MG PO TABS
2.0000 | ORAL_TABLET | ORAL | Status: DC | PRN
  Administered 2023-10-30 – 2023-10-31 (×4): 2 via ORAL
  Filled 2023-10-30 (×4): qty 2

## 2023-10-30 MED ORDER — SCOPOLAMINE 1 MG/3DAYS TD PT72
MEDICATED_PATCH | TRANSDERMAL | Status: AC
  Filled 2023-10-30: qty 1

## 2023-10-30 MED ORDER — PROPOFOL 10 MG/ML IV BOLUS
INTRAVENOUS | Status: AC
  Filled 2023-10-30: qty 20

## 2023-10-30 MED ORDER — PROPOFOL 500 MG/50ML IV EMUL
INTRAVENOUS | Status: DC | PRN
  Administered 2023-10-30: 125 ug/kg/min via INTRAVENOUS

## 2023-10-30 MED ORDER — ACETAMINOPHEN 325 MG PO TABS: 650.0000 mg | ORAL_TABLET | ORAL | Status: DC | PRN

## 2023-10-30 MED ORDER — GABAPENTIN 100 MG PO CAPS
200.0000 mg | ORAL_CAPSULE | Freq: Every day | ORAL | Status: DC
  Administered 2023-10-30: 200 mg via ORAL
  Filled 2023-10-30: qty 2

## 2023-10-30 MED ORDER — ALUM & MAG HYDROXIDE-SIMETH 200-200-20 MG/5ML PO SUSP: 30.0000 mL | Freq: Four times a day (QID) | ORAL | Status: DC | PRN

## 2023-10-30 MED ORDER — ROCURONIUM BROMIDE 10 MG/ML (PF) SYRINGE
PREFILLED_SYRINGE | INTRAVENOUS | Status: AC
  Filled 2023-10-30: qty 10

## 2023-10-30 MED ORDER — CEFAZOLIN SODIUM-DEXTROSE 2-4 GM/100ML-% IV SOLN
2.0000 g | Freq: Three times a day (TID) | INTRAVENOUS | Status: AC
  Administered 2023-10-30 – 2023-10-31 (×2): 2 g via INTRAVENOUS
  Filled 2023-10-30 (×2): qty 100

## 2023-10-30 MED ORDER — LIDOCAINE-EPINEPHRINE 1 %-1:100000 IJ SOLN
INTRAMUSCULAR | Status: AC
  Filled 2023-10-30: qty 1

## 2023-10-30 MED ORDER — ACETAMINOPHEN 650 MG RE SUPP: 650.0000 mg | RECTAL | Status: DC | PRN

## 2023-10-30 MED ORDER — FENTANYL CITRATE (PF) 100 MCG/2ML IJ SOLN
25.0000 ug | INTRAMUSCULAR | Status: DC | PRN
  Administered 2023-10-30: 25 ug via INTRAVENOUS

## 2023-10-30 MED ORDER — SUGAMMADEX SODIUM 200 MG/2ML IV SOLN
INTRAVENOUS | Status: DC | PRN
  Administered 2023-10-30: 150 mg via INTRAVENOUS
  Administered 2023-10-30: 50 mg via INTRAVENOUS

## 2023-10-30 MED ORDER — PANTOPRAZOLE SODIUM 40 MG IV SOLR: 40.0000 mg | Freq: Every day | INTRAVENOUS | Status: DC

## 2023-10-30 MED ORDER — ROCURONIUM BROMIDE 10 MG/ML (PF) SYRINGE
PREFILLED_SYRINGE | INTRAVENOUS | Status: DC | PRN
  Administered 2023-10-30: 20 mg via INTRAVENOUS
  Administered 2023-10-30 (×2): 10 mg via INTRAVENOUS
  Administered 2023-10-30: 40 mg via INTRAVENOUS
  Administered 2023-10-30 (×2): 10 mg via INTRAVENOUS
  Administered 2023-10-30 (×2): 20 mg via INTRAVENOUS

## 2023-10-30 MED ORDER — ACETAMINOPHEN 500 MG PO TABS
1000.0000 mg | ORAL_TABLET | Freq: Once | ORAL | Status: AC
  Administered 2023-10-30: 1000 mg via ORAL
  Filled 2023-10-30: qty 2

## 2023-10-30 MED ORDER — SODIUM CHLORIDE 0.9% FLUSH: 3.0000 mL | INTRAVENOUS | Status: DC | PRN

## 2023-10-30 MED ORDER — FENTANYL CITRATE (PF) 250 MCG/5ML IJ SOLN
INTRAMUSCULAR | Status: DC | PRN
  Administered 2023-10-30 (×2): 50 ug via INTRAVENOUS

## 2023-10-30 MED ORDER — ORAL CARE MOUTH RINSE: 15.0000 mL | Freq: Once | OROMUCOSAL | Status: AC

## 2023-10-30 MED ORDER — LACTATED RINGERS IV SOLN: INTRAVENOUS | Status: DC

## 2023-10-30 MED ORDER — CYCLOBENZAPRINE HCL 10 MG PO TABS
10.0000 mg | ORAL_TABLET | Freq: Three times a day (TID) | ORAL | Status: DC | PRN
  Administered 2023-10-30: 10 mg via ORAL
  Filled 2023-10-30: qty 1

## 2023-10-30 MED ORDER — DEXAMETHASONE SODIUM PHOSPHATE 10 MG/ML IJ SOLN
INTRAMUSCULAR | Status: DC | PRN
  Administered 2023-10-30: 10 mg via INTRAVENOUS

## 2023-10-30 MED ORDER — ONDANSETRON HCL 4 MG PO TABS: 4.0000 mg | ORAL_TABLET | Freq: Four times a day (QID) | ORAL | Status: DC | PRN

## 2023-10-30 MED ORDER — PROPOFOL 10 MG/ML IV BOLUS
INTRAVENOUS | Status: DC | PRN
  Administered 2023-10-30: 50 mg via INTRAVENOUS
  Administered 2023-10-30: 130 mg via INTRAVENOUS
  Administered 2023-10-30: 20 mg via INTRAVENOUS

## 2023-10-30 MED ORDER — THROMBIN 20000 UNITS EX SOLR
CUTANEOUS | Status: AC
  Filled 2023-10-30: qty 20000

## 2023-10-30 MED ORDER — MENTHOL 3 MG MT LOZG: 1.0000 | LOZENGE | OROMUCOSAL | Status: DC | PRN

## 2023-10-30 MED ORDER — LIDOCAINE-EPINEPHRINE 1 %-1:100000 IJ SOLN
INTRAMUSCULAR | Status: DC | PRN
  Administered 2023-10-30: 10 mL

## 2023-10-30 MED ORDER — ONE-DAILY MULTI VITAMINS PO TABS: 1.0000 | ORAL_TABLET | Freq: Every day | ORAL | Status: DC

## 2023-10-30 MED ORDER — FLUTICASONE PROPIONATE 50 MCG/ACT NA SUSP: 1.0000 | Freq: Every day | NASAL | Status: DC | PRN

## 2023-10-30 MED ORDER — THROMBIN 20000 UNITS EX SOLR
CUTANEOUS | Status: DC | PRN
  Administered 2023-10-30: 20 mL via TOPICAL

## 2023-10-30 MED ORDER — ALBUMIN HUMAN 5 % IV SOLN: INTRAVENOUS | Status: DC | PRN

## 2023-10-30 MED ORDER — PROPOFOL 1000 MG/100ML IV EMUL
INTRAVENOUS | Status: AC
  Filled 2023-10-30: qty 100

## 2023-10-30 MED ORDER — CHLORHEXIDINE GLUCONATE CLOTH 2 % EX PADS: 6.0000 | MEDICATED_PAD | Freq: Once | CUTANEOUS | Status: DC

## 2023-10-30 MED ORDER — DULOXETINE HCL 20 MG PO CPEP: 20.0000 mg | ORAL_CAPSULE | Freq: Every day | ORAL | Status: DC

## 2023-10-30 MED ORDER — MIDAZOLAM HCL 2 MG/2ML IJ SOLN
INTRAMUSCULAR | Status: AC
  Filled 2023-10-30: qty 2

## 2023-10-30 MED ORDER — BUPIVACAINE HCL (PF) 0.25 % IJ SOLN
INTRAMUSCULAR | Status: AC
  Filled 2023-10-30: qty 30

## 2023-10-30 MED ORDER — LIDOCAINE 2% (20 MG/ML) 5 ML SYRINGE
INTRAMUSCULAR | Status: DC | PRN
  Administered 2023-10-30: 60 mg via INTRAVENOUS

## 2023-10-30 MED ORDER — MIDAZOLAM HCL 2 MG/2ML IJ SOLN
INTRAMUSCULAR | Status: DC | PRN
  Administered 2023-10-30 (×2): 1 mg via INTRAVENOUS

## 2023-10-30 MED ORDER — ONDANSETRON HCL 4 MG/2ML IJ SOLN: 4.0000 mg | Freq: Four times a day (QID) | INTRAMUSCULAR | Status: DC | PRN

## 2023-10-30 MED ORDER — OXYCODONE HCL 5 MG PO TABS: 5.0000 mg | ORAL_TABLET | Freq: Once | ORAL | Status: DC | PRN

## 2023-10-30 MED ORDER — CHLORHEXIDINE GLUCONATE 0.12 % MT SOLN
15.0000 mL | Freq: Once | OROMUCOSAL | Status: AC
  Administered 2023-10-30: 15 mL via OROMUCOSAL
  Filled 2023-10-30: qty 15

## 2023-10-30 MED ORDER — SODIUM CHLORIDE 0.9 % IV SOLN
250.0000 mL | INTRAVENOUS | Status: DC
  Administered 2023-10-30: 250 mL via INTRAVENOUS

## 2023-10-30 MED ORDER — CEFAZOLIN SODIUM-DEXTROSE 2-4 GM/100ML-% IV SOLN
2.0000 g | INTRAVENOUS | Status: AC
  Administered 2023-10-30: 2 g via INTRAVENOUS
  Filled 2023-10-30: qty 100

## 2023-10-30 SURGICAL SUPPLY — 66 items
BAG COUNTER SPONGE SURGICOUNT (BAG) ×1 IMPLANT
BASKET BONE COLLECTION (BASKET) ×1 IMPLANT
BENZOIN TINCTURE PRP APPL 2/3 (GAUZE/BANDAGES/DRESSINGS) ×1 IMPLANT
BLADE BONE MILL MEDIUM (MISCELLANEOUS) ×1 IMPLANT
BLADE CLIPPER SURG (BLADE) IMPLANT
BLADE SURG 11 STRL SS (BLADE) ×1 IMPLANT
BONE VIVIGEN FORMABLE 1.3CC (Bone Implant) ×1 IMPLANT
BUR CUTTER 7.0 ROUND (BURR) ×1 IMPLANT
BUR MATCHSTICK NEURO 3.0 LAGG (BURR) ×1 IMPLANT
CANISTER SUCTION 3000ML PPV (SUCTIONS) ×1 IMPLANT
CNTNR URN SCR LID CUP LEK RST (MISCELLANEOUS) ×1 IMPLANT
COVER BACK TABLE 60X90IN (DRAPES) ×1 IMPLANT
DERMABOND ADVANCED .7 DNX12 (GAUZE/BANDAGES/DRESSINGS) ×1 IMPLANT
DRAPE C-ARM 42X72 X-RAY (DRAPES) ×2 IMPLANT
DRAPE C-ARMOR (DRAPES) IMPLANT
DRAPE HALF SHEET 40X57 (DRAPES) IMPLANT
DRAPE LAPAROTOMY 100X72X124 (DRAPES) ×1 IMPLANT
DRAPE SURG 17X23 STRL (DRAPES) ×1 IMPLANT
DRSG OPSITE 4X5.5 SM (GAUZE/BANDAGES/DRESSINGS) ×1 IMPLANT
DRSG OPSITE POSTOP 4X6 (GAUZE/BANDAGES/DRESSINGS) ×1 IMPLANT
DURAPREP 26ML APPLICATOR (WOUND CARE) ×1 IMPLANT
ELECTRODE REM PT RTRN 9FT ADLT (ELECTROSURGICAL) ×1 IMPLANT
EVACUATOR 1/8 PVC DRAIN (DRAIN) IMPLANT
GAUZE 4X4 16PLY ~~LOC~~+RFID DBL (SPONGE) IMPLANT
GAUZE SPONGE 4X4 12PLY STRL (GAUZE/BANDAGES/DRESSINGS) ×1 IMPLANT
GLOVE BIO SURGEON STRL SZ7 (GLOVE) IMPLANT
GLOVE BIO SURGEON STRL SZ8 (GLOVE) ×2 IMPLANT
GLOVE BIOGEL PI IND STRL 7.0 (GLOVE) IMPLANT
GLOVE ECLIPSE 6.5 STRL STRAW (GLOVE) IMPLANT
GLOVE EXAM NITRILE XL STR (GLOVE) IMPLANT
GLOVE INDICATOR 8.5 STRL (GLOVE) ×2 IMPLANT
GOWN STRL REUS W/ TWL LRG LVL3 (GOWN DISPOSABLE) IMPLANT
GOWN STRL REUS W/ TWL XL LVL3 (GOWN DISPOSABLE) ×2 IMPLANT
GOWN STRL REUS W/TWL 2XL LVL3 (GOWN DISPOSABLE) IMPLANT
GRAFT BNE MATRIX VG FRMBL SM 1 (Bone Implant) IMPLANT
KIT BASIN OR (CUSTOM PROCEDURE TRAY) ×1 IMPLANT
KIT TURNOVER KIT B (KITS) ×1 IMPLANT
MILL BONE PREP (MISCELLANEOUS) ×1 IMPLANT
NDL HYPO 21X1.5 SAFETY (NEEDLE) IMPLANT
NDL HYPO 25X1 1.5 SAFETY (NEEDLE) ×1 IMPLANT
NEEDLE HYPO 21X1.5 SAFETY (NEEDLE) ×1 IMPLANT
NEEDLE HYPO 25X1 1.5 SAFETY (NEEDLE) ×1 IMPLANT
NS IRRIG 1000ML POUR BTL (IV SOLUTION) ×1 IMPLANT
PACK LAMINECTOMY NEURO (CUSTOM PROCEDURE TRAY) ×1 IMPLANT
PAD ARMBOARD POSITIONER FOAM (MISCELLANEOUS) ×3 IMPLANT
PATTIES SURGICAL .5 X.5 (GAUZE/BANDAGES/DRESSINGS) ×1 IMPLANT
PATTIES SURGICAL 1X1 (DISPOSABLE) ×1 IMPLANT
ROD LORD LIPPED TI 5.5X35 (Rod) IMPLANT
SCREW CANC SHANK MOD 5.5X30 (Screw) IMPLANT
SCREW POLYAXIAL TULIP (Screw) IMPLANT
SCREW SHANK INV 5X30 (Screw) IMPLANT
SET SCREW SPNE (Screw) IMPLANT
SPACER IDENTITI PS 9X22X9 10D (Spacer) IMPLANT
SPIKE FLUID TRANSFER (MISCELLANEOUS) ×1 IMPLANT
SPONGE SURGIFOAM ABS GEL 100 (HEMOSTASIS) ×1 IMPLANT
SPONGE T-LAP 4X18 ~~LOC~~+RFID (SPONGE) IMPLANT
STRIP CLOSURE SKIN 1/2X4 (GAUZE/BANDAGES/DRESSINGS) ×2 IMPLANT
SUT VIC AB 0 CT1 18XCR BRD8 (SUTURE) ×2 IMPLANT
SUT VIC AB 2-0 CT1 18 (SUTURE) ×1 IMPLANT
SUT VIC AB 4-0 PS2 27 (SUTURE) ×1 IMPLANT
SYR 20ML LL LF (SYRINGE) IMPLANT
SYR 30ML SLIP (SYRINGE) ×1 IMPLANT
TOWEL GREEN STERILE (TOWEL DISPOSABLE) ×1 IMPLANT
TOWEL GREEN STERILE FF (TOWEL DISPOSABLE) ×1 IMPLANT
TRAY FOLEY MTR SLVR 16FR STAT (SET/KITS/TRAYS/PACK) ×1 IMPLANT
WATER STERILE IRR 1000ML POUR (IV SOLUTION) ×1 IMPLANT

## 2023-10-30 NOTE — Plan of Care (Signed)

## 2023-10-30 NOTE — Anesthesia Postprocedure Evaluation (Signed)
 Anesthesia Post Note  Patient: CARRI SPILLERS  Procedure(s) Performed: POSTERIOR LUMBAR INTERBODY FUSION LUMBAR FOUR-FIVE WITH INSTRUMENTATION (Back)     Patient location during evaluation: PACU Anesthesia Type: General Level of consciousness: awake and alert Pain management: pain level controlled Vital Signs Assessment: post-procedure vital signs reviewed and stable Respiratory status: spontaneous breathing, nonlabored ventilation, respiratory function stable and patient connected to nasal cannula oxygen Cardiovascular status: blood pressure returned to baseline and stable Postop Assessment: no apparent nausea or vomiting Anesthetic complications: no   No notable events documented.  Last Vitals:  Vitals:   10/30/23 1151 10/30/23 1200  BP: 137/62 (!) 133/56  Pulse: 63 (!) 54  Resp: 12 10  Temp: 36.8 C   SpO2: 100% 100%    Last Pain:  Vitals:   10/30/23 1151  TempSrc:   PainSc: 0-No pain                 Mikiya Nebergall

## 2023-10-30 NOTE — Anesthesia Postprocedure Evaluation (Signed)
 Anesthesia Post Note  Patient: Dorothy [name]  Procedure(s) Performed: POSTERIOR LUMBAR INTERBODY FUSION LUMBAR FOUR-FIVE WITH INSTRUMENTATION (Back)     Patient location during evaluation: PACU Anesthesia Type: General Level of consciousness: awake and alert Pain management: pain level controlled Vital Signs Assessment: post-procedure vital signs reviewed and stable Respiratory status: spontaneous breathing, nonlabored ventilation, respiratory function stable and patient connected to nasal cannula oxygen Cardiovascular status: blood pressure returned to baseline and stable Postop Assessment: no apparent nausea or vomiting Anesthetic complications: no   No notable events documented.  Last Vitals:  Vitals:   10/30/23 1151 10/30/23 1200  BP: 137/62 (!) 133/56  Pulse: 63 (!) 54  Resp: 12 10  Temp: 36.8 C   SpO2: 100% 100%    Last Pain:  Vitals:   10/30/23 1151  TempSrc:   PainSc: 0-No pain    LLE Motor Response: Purposeful movement (10/30/23 1151) LLE Sensation: Full sensation (10/30/23 1151) RLE Motor Response: Purposeful movement (10/30/23 1151) RLE Sensation: Full sensation (10/30/23 1151)      Galileah Piggee

## 2023-10-30 NOTE — Transfer of Care (Signed)
 Immediate Anesthesia Transfer of Care Note  Patient: Dorothy [name]  Procedure(s) Performed: POSTERIOR LUMBAR INTERBODY FUSION LUMBAR FOUR-FIVE WITH INSTRUMENTATION (Back)  Patient Location: PACU  Anesthesia Type:General  Level of Consciousness: awake, alert , and oriented  Airway & Oxygen Therapy: Patient connected to face mask oxygen  Post-op Assessment: Report given to RN and Post -op Vital signs reviewed and stable  Post vital signs: Reviewed and stable  Last Vitals:  Vitals Value Taken Time  BP 137/62 10/30/23 11:50  Temp 36.8 C 10/30/23 11:51  Pulse 59 10/30/23 11:54  Resp 14 10/30/23 11:54  SpO2 100 % 10/30/23 11:54  Vitals shown include unfiled device data.  Last Pain:  Vitals:   10/30/23 0754  TempSrc:   PainSc: 6       Patients Stated Pain Goal: 3 (10/30/23 0754)  Complications: No notable events documented.

## 2023-10-30 NOTE — Op Note (Signed)
 Preoperative diagnosis: Grade 1 spondylolisthesis L4-5 with lumbar spinal stenosis L4-L5 radiculopathies and instability.  Postoperative diagnosis: Same.  Procedure: Bilateral decompressive laminotomies L4-5 with complete medial facetectomies and foraminotomies the L4-L5 nerve roots.  2.  Posterior lumbar interbody fusion L4-5 utilizing the Alphatec titanium cages packed with locally harvested autograft mixed with Vivigen.  3.  Cortical screw fixation L4-5 utilizing the Alphatec modular cortical screw set.  4.  Open reduction spinal deformity L4-5.  Surgeon: Arley helling.  Assistant: Rockey [place].  Assistant to: Suzen Click  Anesthesia: General.  EBL: Minimal.  HPI: 68 year old female progressive worsening back and bilateral hip and leg pain worse on the right workup revealed a dynamic spondylolisthesis at L4-5 with severe spinal stenosis.  Due to patient's progression of clinical syndrome imaging findings and failed conservative treatment I recommended decompressive laminectomies and posterior lumbar to body fusion L4-5 I extensively gone over the risks and benefits of that operation with her as well as perioperative course expectations of outcome and alternatives to surgery and she understood and agreed to proceed forward.  Operative procedure: Patient was brought into the OR was induced under general anesthesia positioned prone the Wilson frame her back was prepped and draped in routine sterile fashion.  After infiltration of 10 cc lidocaine  with epi midline incision was made and subperiosteal dissection was carried lamina of L4 and L5 confirmed by interoperative x-ray.  High-speed drill was used to drill down the medial facet complexes capturing the bone shavings and mucus trap bilateral laminotomies and complete facetectomies were performed foraminotomies of the L4 and L5 nerve roots were also performed aggressive undermining superior tickling facet gained access to the lateral margin of  the space.  There was marked hourglass compression of the 5 none nerve roots this was all removed and the foramina were unroofed.  This space was incised cleaned out bilaterally utilizing sequential distraction we prepared the endplates and performed interbody fusions with titanium cages with an extensive mount of autograft mix packed centrally.  All this was done under fluoroscopy to confirm good position of the cages then cortical screws were placed at L4 and L5 under fluoroscopy all screws had excellent purchase heads were assembled the wound was copiously irrigated meticulous hemostasis was maintained all the foramina reinspected to confirm patency rods were then placed and compressed L4 against L5 and then the wound was closed in layers with interrupted Vicryl and a running 4 subcuticular.  Dermabond benzoin Steri-Strips and a sterile dressing was applied patient to cover him in stable condition.  At the end the case all needle counts and sponge counts were correct.

## 2023-10-30 NOTE — H&P (Signed)
 Dorothy [name] is an 68 y.o. female.   Chief Complaint: Back and right greater than left leg pain HPI: 68 year old female progressive worsening back and right leg pain refractory to all forms conservative treatment workup revealed a dynamic spondylolisthesis at L4-5 with movement on flexion-extension films.  Due to her failed conservative treatment imaging findings and progression of clinical syndrome I recommended decompressive laminectomy and interbody fusion at that level.  I extensively reviewed the risks and benefits of the operation with the patient as well as perioperative course expectations of outcome and alternatives to surgery and she understood and agreed to proceed forward.  Past Medical History:  Diagnosis Date   Complication of anesthesia    PONV (postoperative nausea and vomiting)     Past Surgical History:  Procedure Laterality Date   CESAREAN SECTION  04/16/1992   LASIK  2005    Family History  Problem Relation Age of Onset   Hypertension Mother    Cancer Father        BRAIN TUMOR   Social History:  reports that she has never smoked. She has never used smokeless tobacco. She reports current alcohol use. She reports that she does not use drugs.  Allergies: No Known Allergies  Medications Prior to Admission  Medication Sig Dispense Refill   fluticasone  (FLONASE ) 50 MCG/ACT nasal spray Place 1 spray into both nostrils daily as needed for allergies or rhinitis.     gabapentin  (NEURONTIN ) 100 MG capsule TAKE 2 CAPSULES BY MOUTH AT BEDTIME. 60 capsule 3   Multiple Vitamin (MULTIVITAMIN) tablet Take 1 tablet by mouth daily.     DULoxetine  (CYMBALTA ) 20 MG capsule TAKE 1 CAPSULE BY MOUTH EVERY DAY (Patient not taking: Reported on 10/09/2023) 90 capsule 1    Results for orders placed or performed during the hospital encounter of 10/30/23 (from the past 48 hours)  Type and screen [facility]     Status: None   Collection Time: 10/30/23  7:49 AM  Result  Value Ref Range   ABO/RH(D) O NEG    Antibody Screen NEG    Sample Expiration      11/02/2023,2359 Performed at [facility], [address], [place], [place] [address]    No results found.  Review of Systems  Musculoskeletal:  Positive for back pain.  Neurological:  Positive for numbness.    Blood pressure 127/77, pulse 71, temperature 98 F (36.7 C), temperature source Oral, resp. rate 17, height 5' 1 (1.549 m), weight 52.2 kg, last menstrual period 04/16/2008, SpO2 98%. Physical Exam HENT:     Head: Normocephalic.     Right Ear: Tympanic membrane normal.     Nose: Nose normal.     Mouth/Throat:     Mouth: Mucous membranes are moist.  Cardiovascular:     Rate and Rhythm: Normal rate.     Pulses: Normal pulses.  Pulmonary:     Effort: Pulmonary effort is normal.  Musculoskeletal:        General: Normal range of motion.     Cervical back: Normal range of motion.  Skin:    General: Skin is warm.  Neurological:     Mental Status: She is alert.     Comments: Strength is 5-5 iliopsoas, quads, hamstrings, gastroc, tibialis, and EHL.      Assessment/Plan 68 year old female presents for decompressive laminectomy interbody fusion L4-5  Arley SHAUNNA Helling, MD 10/30/2023, 9:12 AM

## 2023-10-30 NOTE — Anesthesia Procedure Notes (Addendum)
 Procedure Name: Intubation Date/Time: 10/30/2023 9:34 AM  Performed by: Peggye Delon Brunswick, MDPre-anesthesia Checklist: Patient identified, Emergency Drugs available, Suction available and Patient being monitored Patient Re-evaluated:Patient Re-evaluated prior to induction Oxygen Delivery Method: Circle system utilized Preoxygenation: Pre-oxygenation with 100% oxygen Induction Type: IV induction Ventilation: Mask ventilation without difficulty Laryngoscope Size: 3 Grade View: Grade II Tube type: Oral Tube size: 7.0 mm Number of attempts: 1 Airway Equipment and Method: Stylet Placement Confirmation: ETT inserted through vocal cords under direct vision, positive ETCO2 and breath sounds checked- equal and bilateral Secured at: 20 cm Tube secured with: Tape Dental Injury: Teeth and Oropharynx as per pre-operative assessment

## 2023-10-31 DIAGNOSIS — M48061 Spinal stenosis, lumbar region without neurogenic claudication: Secondary | ICD-10-CM | POA: Diagnosis not present

## 2023-10-31 DIAGNOSIS — M5416 Radiculopathy, lumbar region: Secondary | ICD-10-CM | POA: Diagnosis not present

## 2023-10-31 DIAGNOSIS — M4316 Spondylolisthesis, lumbar region: Secondary | ICD-10-CM | POA: Diagnosis not present

## 2023-10-31 MED ORDER — CYCLOBENZAPRINE HCL 10 MG PO TABS: 10.0000 mg | ORAL_TABLET | Freq: Three times a day (TID) | ORAL | 0 refills | Status: AC | PRN

## 2023-10-31 MED ORDER — HYDROCODONE-ACETAMINOPHEN 5-325 MG PO TABS: 2.0000 | ORAL_TABLET | ORAL | 0 refills | Status: AC | PRN

## 2023-10-31 NOTE — Progress Notes (Signed)
 Patient alert and oriented, mae's well, voiding adequate amount of urine, swallowing without difficulty, no c/o pain at time of discharge. Patient discharged home with family. Script and discharged instructions given to patient. Patient and family stated understanding of instructions given. Room was checked and accounted for all patient's belongings; discharge instructions concerning her medications, incision care, follow up appointment and when to call the doctor as needed were all discussed with patient by RN and she expressed understanding on the instructions given.

## 2023-10-31 NOTE — Evaluation (Signed)
 Occupational Therapy Evaluation Patient Details Name: Dorothy [name] MRN: [id number] DOB: 13-Aug-1955 Today's Date: 10/31/2023   History of Present Illness   Pt is a 68 yr old female who presented 10/30/23 due to back and R> L LLE pain. Pt s/p PLIF  L4-5 10/30/23. PMH: PONV     Clinical Impressions Pt reported at PLOF they were playing pickle, golf and went to exercises class regular but was limited due to pain. At this time she was able to complete dressing with mod I, donned/doff brace with no assist, ambulated with supervision and completed stair climbing with rail on R side a flight of steps with supervision. Pt at this time has no other concerns and will have family support when intimally going home. Acute Occupational Therapy singing off.      If plan is discharge home, recommend the following:   Help with stairs or ramp for entrance;Assistance with cooking/housework     Functional Status Assessment   Patient has had a recent decline in their functional status and demonstrates the ability to make significant improvements in function in a reasonable and predictable amount of time.     Equipment Recommendations   None recommended by OT     Recommendations for Other Services         Precautions/Restrictions   Precautions Precautions: Back Precaution Booklet Issued: Yes (comment) Recall of Precautions/Restrictions: Intact Required Braces or Orthoses: Spinal Brace Spinal Brace: Lumbar corset;Applied in sitting position;Applied in standing position Restrictions Weight Bearing Restrictions Per Provider Order: No     Mobility Bed Mobility Overal bed mobility: Needs Assistance Bed Mobility: Supine to Sit     Supine to sit: Modified independent (Device/Increase time)     General bed mobility comments: able to follow log roll    Transfers Overall transfer level: Needs assistance Equipment used: None Transfers: Sit to/from Stand Sit to Stand: Modified  independent (Device/Increase time)                  Balance Overall balance assessment: Modified Independent                                         ADL either performed or assessed with clinical judgement   ADL Overall ADL's : Modified independent                                       General ADL Comments: to be able to follow back precautions     Vision Baseline Vision/History: 1 Wears glasses Ability to See in Adequate Light: 0 Adequate Patient Visual Report: No change from baseline Vision Assessment?: Wears glasses for reading     Perception Perception: Within Functional Limits       Praxis Praxis: WFL       Pertinent Vitals/Pain Pain Assessment Pain Assessment: 0-10 Pain Score: 3  Pain Location: sx site Pain Descriptors / Indicators: Aching Pain Intervention(s): Limited activity within patient's tolerance, Monitored during session, Repositioned, Ice applied     Extremity/Trunk Assessment Upper Extremity Assessment Upper Extremity Assessment: Overall WFL for tasks assessed;Right hand dominant   Lower Extremity Assessment Lower Extremity Assessment: Overall WFL for tasks assessed   Cervical / Trunk Assessment Cervical / Trunk Assessment: Back Surgery   Communication Communication Communication: No apparent difficulties   Cognition Arousal: Alert Behavior  During Therapy: WFL for tasks assessed/performed Cognition: No apparent impairments                               Following commands: Intact       Cueing  General Comments   Cueing Techniques: Verbal cues      Exercises     Shoulder Instructions      Home Living Family/patient expects to be discharged to:: Private residence Living Arrangements: Children Available Help at Discharge: Family (son is going to assist and then her sister will be coming into town from [place]) Type of Home: House Home Access: Stairs to enter [employer]  of Steps: 3-4 Entrance Stairs-Rails: Right;Left (can only reach one side at a time) Home Layout: Two level;Able to live on main level with bedroom/bathroom Alternate Level Stairs-Number of Steps: one flight   Bathroom Shower/Tub: Producer, television/film/video: Standard Bathroom Accessibility: Yes How Accessible: Accessible via walker Home Equipment: Shower seat - built Designer, fashion/clothing: Reacher        Prior Functioning/Environment Prior Level of Function : Independent/Modified Independent             Mobility Comments: was playing pickleball prior to sx ADLs Comments: indep    OT Problem List: Pain;Decreased knowledge of use of DME or AE;Decreased safety awareness;Impaired balance (sitting and/or standing);Decreased activity tolerance   OT Treatment/Interventions:        OT Goals(Current goals can be found in the care plan section)   Acute Rehab OT Goals Patient Stated Goal: to go home OT Goal Formulation: With patient Time For Goal Achievement: 11/14/23 Potential to Achieve Goals: Good   OT Frequency:       Co-evaluation              AM-PAC OT 6 Clicks Daily Activity     Outcome Measure Help from another person eating meals?: None Help from another person taking care of personal grooming?: None Help from another person toileting, which includes using toliet, bedpan, or urinal?: None Help from another person bathing (including washing, rinsing, drying)?: None Help from another person to put on and taking off regular upper body clothing?: None Help from another person to put on and taking off regular lower body clothing?: None 6 Click Score: 24   End of Session Equipment Utilized During Treatment: Gait belt Nurse Communication: Mobility status  Activity Tolerance: Patient tolerated treatment well Patient left: Other (comment) (pt wanted to go into bathroom but room was set up if needed call light)  OT Visit Diagnosis:  Pain;Muscle weakness (generalized) (M62.81) Pain - part of body:  (back)                Time: 9242-9177 OT Time Calculation (min): 25 min Charges:  OT General Charges $OT Visit: 1 Visit OT Evaluation $OT Eval Low Complexity: 1 Low OT Treatments $Self Care/Home Management : 8-22 mins  Dorothy [name]  Acute Rehab Services  [phone] office number   Dorothy [name] 10/31/2023, 9:40 AM

## 2023-10-31 NOTE — Discharge Summary (Signed)
 Physician Discharge Summary  Patient ID: Dorothy [name] MRN: [id number] DOB/AGE: [birth year] 68 y.o. Estimated body mass index is 21.73 kg/m as calculated from the following:   Height as of this encounter: 5' 1 (1.549 m).   Weight as of this encounter: 52.2 kg.   Admit date: 10/30/2023 Discharge date: 10/31/2023  Admission Diagnoses: 1 spondylolisthesis L4-5  Discharge Diagnoses: Same Principal Problem:   Spinal stenosis at L4-L5 level   Discharged Condition: good  Hospital Course: Patient was admitted to the hospital underwent posterior lumbar decompression interbody fusion.  Postoperative patient did very well with covering the floor the floor was ambulating and voiding spontaneously tolerating regular diet stable for discharge home.  Patient will discharge schedule follow-up in 1 to 2 weeks.  Consults: Significant Diagnostic Studies: Treatments: Left L4-5 Discharge Exam: Blood pressure 116/63, pulse (!) 57, temperature 98.8 F (37.1 C), temperature source Oral, resp. rate 18, height 5' 1 (1.549 m), weight 52.2 kg, last menstrual period 04/16/2008, SpO2 98%. Strength 5 out of 5  Disposition: Home   Allergies as of 10/31/2023   No Known Allergies      Medication List     TAKE these medications    cyclobenzaprine  10 MG tablet Commonly known as: FLEXERIL  Take 1 tablet (10 mg total) by mouth 3 (three) times daily as needed for muscle spasms.   DULoxetine  20 MG capsule Commonly known as: CYMBALTA  TAKE 1 CAPSULE BY MOUTH EVERY DAY   fluticasone  50 MCG/ACT nasal spray Commonly known as: FLONASE  Place 1 spray into both nostrils daily as needed for allergies or rhinitis.   gabapentin  100 MG capsule Commonly known as: NEURONTIN  TAKE 2 CAPSULES BY MOUTH AT BEDTIME.   HYDROcodone -acetaminophen  5-325 MG tablet Commonly known as: NORCO/VICODIN Take 2 tablets by mouth every 4 (four) hours as needed for severe pain (pain score 7-10).   multivitamin tablet Take 1  tablet by mouth daily.         Signed: Arley SHAUNNA Helling 10/31/2023, 8:00 AM

## 2023-10-31 NOTE — Discharge Instructions (Signed)
 Wound Care Keep incision covered and dry until post op day 3. You may remove the Honeycomb dressing on post op day 3. Leave steri-strips on back.  They will fall off by themselves. Do not put any creams, lotions, or ointments on incision. You are fine to shower. Let water run over incision and pat dry.   Activity Walk each and every day, increasing distance each day. No lifting greater than 8 lbs.  No lifting no bending no twisting no driving or riding a car unless coming back and forth to see the doctor. If provided with back brace, wear when out of bed.  It is not necessary to wear brace in bed.  Diet Resume your normal diet.   Return to Work Will be discussed at your follow up appointment.  Call Your Doctor If Any of These Occur Redness, drainage, or swelling at the wound.  Temperature greater than 101 degrees. Severe pain not relieved by pain medication. Incision starts to come apart.  Follow Up Appt Call [phone] if you have one or any problem.

## 2023-11-18 DIAGNOSIS — Z9189 Other specified personal risk factors, not elsewhere classified: Secondary | ICD-10-CM | POA: Diagnosis not present

## 2023-12-10 DIAGNOSIS — M4317 Spondylolisthesis, lumbosacral region: Secondary | ICD-10-CM | POA: Diagnosis not present

## 2023-12-31 NOTE — Therapy (Signed)
 OUTPATIENT PHYSICAL THERAPY THORACOLUMBAR EVALUATION   Patient Name: Dorothy [name] MRN: [id number] DOB:Sep 17, 1955, 68 y.o., female Today's Date: 01/01/2024  END OF SESSION:  PT End of Session - 01/01/24 1552     Visit Number 1    Date for PT Re-Evaluation 02/26/24    Authorization Type Cohere- auth requested today    Progress Note Due on Visit 10    PT Start Time 1446    PT Stop Time 1532    PT Time Calculation (min) 46 min    Activity Tolerance Patient tolerated treatment well    Behavior During Therapy [facility] for tasks assessed/performed          Past Medical History:  Diagnosis Date   Complication of anesthesia    PONV (postoperative nausea and vomiting)    Past Surgical History:  Procedure Laterality Date   CESAREAN SECTION  04/16/1992   LASIK  2005   Patient Active Problem List   Diagnosis Date Noted   Spinal stenosis at L4-L5 level 10/30/2023   Spinal stenosis, lumbar region, with neurogenic claudication 07/23/2023   Pupillary size inequality 01/29/2023   Patellofemoral arthralgia of right knee 07/07/2021   Tendinitis of right triceps 04/25/2020   Cervicogenic headache 04/29/2018   Nonallopathic lesion of rib cage 02/26/2018   Achilles tendinosis of right lower extremity 09/10/2017   Trigger point of left shoulder region 04/11/2016   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 05/12/2015   Achilles tendinosis 03/14/2015   Trapezius muscle spasm 03/23/2014   Loss of transverse plantar arch 09/08/2013   Nonallopathic lesion of sacral region 07/20/2013   Greater trochanteric bursitis of right hip 06/24/2013   Piriformis syndrome of right side 05/25/2013   Nonallopathic lesion of cervical region 05/11/2013   Sacroiliac joint dysfunction of right side 04/27/2013   Leg length discrepancy 04/27/2013   Nonallopathic lesion of lumbosacral region 04/27/2013   Nonallopathic lesion of thoracic region 04/27/2013   BACK PAIN, LUMBAR 09/30/2009    PCP:  Aisha Harvey, MD  REFERRING PROVIDER: Onetha Kuba, MD  REFERRING DIAG: M43.17 (ICD-10-CM) - Spondylolisthesis, lumbosacral region   Rationale for Evaluation and Treatment: Rehabilitation  THERAPY DIAG:  Muscle weakness (generalized) - Plan: PT plan of care cert/re-cert  Stiffness of left hip, not elsewhere classified - Plan: PT plan of care cert/re-cert  ONSET DATE: surgery 10/2023, Rt radiculopathy 3 year history  SUBJECTIVE:                                                                                                                                                                                           SUBJECTIVE STATEMENT: Pt presents  to PT 2 months s/p lumbar fusion at L4/5.  She had Rt LE radiculopathy for ~3 years prior to surgery. Pt has been able to walk only after surgery and has not been able to play golf, pickleball or take Barre classes and will be cleared for this at 3 months post op.   PERTINENT HISTORY:  PLIF L4/5 10/30/23  PAIN: 01/01/24 Are you having pain? Yes: NPRS scale: low back around incision  Pain location: 1/10 Pain description: discomfort Aggravating factors: doing too much  Relieving factors: rest   PRECAUTIONS: Other: post op PLIF 10/30/23  RED FLAGS: None   WEIGHT BEARING RESTRICTIONS: No  FALLS:  Has patient fallen in last 6 months? No  LIVING ENVIRONMENT: Lives with: lives alone Lives in: House/apartment Stairs: No Has following equipment at home: None  OCCUPATION: retired   PLOF: Independent with basic ADLs, Vocation/Vocational requirements: retired , and Leisure: golf, barre, pickleball   PATIENT GOALS: return to playing golf, Engineer, site and pickleball  NEXT MD VISIT: 1 month   OBJECTIVE:  Note: Objective measures were completed at Evaluation unless otherwise noted.  PATIENT SURVEYS:  Modified Oswestry: 4% disability   COGNITION: Overall cognitive status: Within functional limits for tasks  assessed     SENSATION: WFL   POSTURE: rounded shoulders and forward head  PALPATION: Well healing surgical incision with good mobility   LUMBAR ROM:   Not tested due to post-op restrictions   LOWER EXTREMITY MMT:     5/5 bil knee and ankle, bil hip abduction 4/5, hip flexion 4+/5, extension 4+/5  LOWER EXTREMITY ROM:   Lt hip flexibility limited by 25% vs Rt, difficulty with ER in sitting with putting on shoe/sock   GAIT: Distance walked: 50 Assistive device utilized: None Level of assistance: Complete Independence Comments: WFLs   Reduced eccentric control with stand to sit transition   TREATMENT DATE:  Findings from evaluation discussed, pt educated on plan of care, HEP initiated.                                                                                                                                 PATIENT EDUCATION:  Education details: Access Code: [email or web address] Person educated: Patient Education method: Explanation, Demonstration, and Handouts Education comprehension: verbalized understanding and returned demonstration  HOME EXERCISE PROGRAM: Access Code: [email or web address] URL: [email or web address] Date: 01/01/2024 Prepared by: Burnard  Exercises - Supine Hip Adduction Isometric with Ball  - 1-2 x daily - 7 x weekly - 2 sets - 10 reps - 5 hold - Hooklying Transversus Abdominis Palpation  - 1-2 x daily - 7 x weekly - 2 sets - 10 reps - 5 hold - Clamshell with Resistance  - 2 x daily - 7 x weekly - 2 sets - 10 reps - Sit to Stand Without Arm Support  - 2 x daily - 7 x weekly - 2 sets - 10 reps - Seated Hamstring Stretch  - 2 x  daily - 7 x weekly - 1 sets - 3 reps - 20 hold - Seated Figure 4 Piriformis Stretch  - 3 x daily - 7 x weekly - 1 sets - 3 reps - 20 hold - Step Taps on High Step  - 1 x daily - 7 x weekly - 2 sets - 10 reps  ASSESSMENT:  CLINICAL IMPRESSION: Patient is a 68 y.o. female who was seen today for physical therapy evaluation and  treatment for post op rehab after L4/5 TLIF on 10/30/23.  She wore a TLSO until yesterday and reports weakened core as result of immobilization.  She has been allowed to walk and won't be able to resume her regular activities of playing golf, pickle ball and taking barre classes until she is 3 months post op.  Pt would like to strengthen her core and gain endurance for safe return to these activities.  Pt has difficulty putting on her socks on the Lt due to reduced flexibility.  PT initiated HEP for core strength and flexibility.  Patient will benefit from skilled PT to address the below impairments and improve overall function.   OBJECTIVE IMPAIRMENTS: decreased activity tolerance, decreased endurance, decreased ROM, decreased strength, hypomobility, increased fascial restrictions, increased muscle spasms, impaired flexibility, improper body mechanics, postural dysfunction, and pain.   ACTIVITY LIMITATIONS: carrying, lifting, squatting, dressing, and exercise   PARTICIPATION LIMITATIONS: community activity  PERSONAL FACTORS: Time since onset of injury/illness/exacerbation and 1 comorbidity: s/p lumbar fusion are also affecting patient's functional outcome.   REHAB POTENTIAL: Good  CLINICAL DECISION MAKING: Stable/uncomplicated  EVALUATION COMPLEXITY: Low   GOALS: Goals reviewed with patient? Yes  SHORT TERM GOALS: Target date: 01/29/2024    Be independent in initial HEP Baseline: Goal status: INITIAL  2.  Verbalize and demonstrate body mechanics mechanics modifications for lumbar protection Baseline:  Goal status: INITIAL  3.  Verbalize safe Barre modifications to allow for return to class when cleared by MD Baseline:  Goal status: INITIAL    LONG TERM GOALS: Target date: 02/26/2024    Be independent in advanced HEP Baseline:  Goal status: INITIAL  2.  Improve Lt hip flexibility to put on socks/shoes without difficulty  Baseline:  Goal status: INITIAL  3.  Perform  stand to sit transition with good eccentric control due to improved functional quad and glute strength  Baseline:  Goal status: INITIAL  4.  Begin golf activities including chipping and putting with proper mechanics  Baseline:  Goal status: INITIAL    PLAN:  PT FREQUENCY: 2x/week  PT DURATION: 8 weeks  PLANNED INTERVENTIONS: 97110-Therapeutic exercises, 97530- Therapeutic activity, 97112- Neuromuscular re-education, 97535- Self Care, 02859- Manual therapy, [id number]- Gait training, [phone]- Aquatic Therapy, [phone] (1-2 muscles), 20561 (3+ muscles)- Dry Needling, Patient/Family education, Balance training, Taping, Scar mobilization, Vestibular training, Cryotherapy, and Moist heat.  PLAN FOR NEXT SESSION: review HEP, add to HEP as tolerated for functional and core strength   Burnard Joy, PT 01/01/24 5:00 PM   [facility] [address] [place], [place] [address] Phone # [phone] Fax [phone]

## 2024-01-01 ENCOUNTER — Other Ambulatory Visit: Payer: Self-pay

## 2024-01-01 ENCOUNTER — Ambulatory Visit: Attending: Neurosurgery

## 2024-01-01 DIAGNOSIS — M25652 Stiffness of left hip, not elsewhere classified: Secondary | ICD-10-CM | POA: Insufficient documentation

## 2024-01-01 DIAGNOSIS — M6281 Muscle weakness (generalized): Secondary | ICD-10-CM | POA: Diagnosis not present

## 2024-01-07 ENCOUNTER — Ambulatory Visit: Payer: Self-pay

## 2024-01-07 DIAGNOSIS — M25652 Stiffness of left hip, not elsewhere classified: Secondary | ICD-10-CM | POA: Diagnosis not present

## 2024-01-07 DIAGNOSIS — M6281 Muscle weakness (generalized): Secondary | ICD-10-CM | POA: Diagnosis not present

## 2024-01-07 NOTE — Patient Instructions (Signed)
   Lifting Principles  Maintain proper posture and head alignment. Slide object as close as possible before lifting. Move obstacles out of the way. Test before lifting; ask for help if too heavy. Tighten stomach muscles without holding breath. Use smooth movements; do not jerk. Use legs to do the work, and pivot with feet. Distribute the work load symmetrically and close to the center of trunk. Push instead of pull whenever possible.   Squat down and hold basket close to stand. Use leg muscles to do the work.    Avoid twisting or bending back. Pivot around using foot movements, and bend at knees if needed when reaching for articles.        Getting Into / Out of Bed   Lower self to lie down on one side by raising legs and lowering head at the same time. Use arms to assist moving without twisting. Bend both knees to roll onto back if desired. To sit up, start from lying on side, and use same move-ments in reverse. Keep trunk aligned with legs.    Shift weight from front foot to back foot as item is lifted off shelf.    When leaning forward to pick object up from floor, extend one leg out behind. Keep back straight. Hold onto a sturdy support with other hand.      Sit upright, head facing forward. Try using a roll to support lower back. Keep shoulders relaxed, and avoid rounded back. Keep hips level with knees. Avoid crossing legs for long periods.     [facility] [address], [place], [place] [address] Phone # [phone] Fax [phone]

## 2024-01-07 NOTE — Therapy (Signed)
 OUTPATIENT PHYSICAL THERAPY TREATMENT   Patient Name: Dorothy [name] MRN: [id number] DOB:[birth year], 68 y.o., female Today's Date: 01/07/2024  END OF SESSION:  PT End of Session - 01/07/24 0852     Visit Number 2    Date for Recertification  02/26/24    Authorization Type Cohere: 8 visits 9/17-11/12/25    Authorization - Visit Number 2    Authorization - Number of Visits 8    Progress Note Due on Visit 10    PT Start Time 0803    PT Stop Time 0843    PT Time Calculation (min) 40 min    Activity Tolerance Patient tolerated treatment well    Behavior During Therapy [facility] for tasks assessed/performed           Past Medical History:  Diagnosis Date   Complication of anesthesia    PONV (postoperative nausea and vomiting)    Past Surgical History:  Procedure Laterality Date   CESAREAN SECTION  04/16/1992   LASIK  2005   Patient Active Problem List   Diagnosis Date Noted   Spinal stenosis at L4-L5 level 10/30/2023   Spinal stenosis, lumbar region, with neurogenic claudication 07/23/2023   Pupillary size inequality 01/29/2023   Patellofemoral arthralgia of right knee 07/07/2021   Tendinitis of right triceps 04/25/2020   Cervicogenic headache 04/29/2018   Nonallopathic lesion of rib cage 02/26/2018   Achilles tendinosis of right lower extremity 09/10/2017   Trigger point of left shoulder region 04/11/2016   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 05/12/2015   Achilles tendinosis 03/14/2015   Trapezius muscle spasm 03/23/2014   Loss of transverse plantar arch 09/08/2013   Nonallopathic lesion of sacral region 07/20/2013   Greater trochanteric bursitis of right hip 06/24/2013   Piriformis syndrome of right side 05/25/2013   Nonallopathic lesion of cervical region 05/11/2013   Sacroiliac joint dysfunction of right side 04/27/2013   Leg length discrepancy 04/27/2013   Nonallopathic lesion of lumbosacral region 04/27/2013   Nonallopathic lesion of thoracic  region 04/27/2013   BACK PAIN, LUMBAR 09/30/2009    PCP: Aisha Harvey, MD  REFERRING PROVIDER: Onetha Kuba, MD  REFERRING DIAG: M43.17 (ICD-10-CM) - Spondylolisthesis, lumbosacral region   Rationale for Evaluation and Treatment: Rehabilitation  THERAPY DIAG:  Muscle weakness (generalized)  Stiffness of left hip, not elsewhere classified  ONSET DATE: surgery 10/2023, Rt radiculopathy 3 year history  SUBJECTIVE:                                                                                                                                                                                           SUBJECTIVE  STATEMENT: I've been doing my exercises.  I'm worried about lifting on my trip.    PERTINENT HISTORY:  PLIF L4/5 10/30/23  PAIN: 01/01/24 Are you having pain? Yes: NPRS scale: low back around incision  Pain location: 1/10 Pain description: discomfort Aggravating factors: doing too much  Relieving factors: rest   PRECAUTIONS: Other: post op PLIF 10/30/23  RED FLAGS: None   WEIGHT BEARING RESTRICTIONS: No  FALLS:  Has patient fallen in last 6 months? No  LIVING ENVIRONMENT: Lives with: lives alone Lives in: House/apartment Stairs: No Has following equipment at home: None  OCCUPATION: retired   PLOF: Independent with basic ADLs, Vocation/Vocational requirements: retired , and Leisure: golf, barre, pickleball   PATIENT GOALS: return to playing golf, Engineer, site and pickleball  NEXT MD VISIT: 1 month   OBJECTIVE:  Note: Objective measures were completed at Evaluation unless otherwise noted.  PATIENT SURVEYS:  Modified Oswestry: 4% disability   COGNITION: Overall cognitive status: Within functional limits for tasks assessed     SENSATION: WFL   POSTURE: rounded shoulders and forward head  PALPATION: Well healing surgical incision with good mobility   LUMBAR ROM:   Not tested due to post-op restrictions   LOWER EXTREMITY MMT:     5/5 bil knee and  ankle, bil hip abduction 4/5, hip flexion 4+/5, extension 4+/5  LOWER EXTREMITY ROM:   Lt hip flexibility limited by 25% vs Rt, difficulty with ER in sitting with putting on shoe/sock   GAIT: Distance walked: 50 Assistive device utilized: None Level of assistance: Complete Independence Comments: WFLs   Reduced eccentric control with stand to sit transition   TREATMENT DATE:  01/07/24:  Body mechanics for lifting related to luggage and air travel. Seated hamstring stretch 2x20 seconds  Sit to stand 5# kettlebell parallel and staggered stance x10 each  Figure 4 2x20 seconds  Pallof press: red band with mini squat 2x10 bil Step taps with Farmer's carry 5# x 20 each side  Sidelying clam with green band 2x10 Forward T x10 each   01/01/24 Findings from evaluation discussed, pt educated on plan of care, HEP initiated.                                                                                                                                 PATIENT EDUCATION:  Education details: Access Code: [phone],  Person educated: Patient Education method: Explanation, Demonstration, and Handouts Education comprehension: verbalized understanding and returned demonstration  HOME EXERCISE PROGRAM: Access Code: [email or web address] URL: [email or web address] Date: 01/01/2024 Prepared by: Burnard  Exercises - Supine Hip Adduction Isometric with Ball  - 1-2 x daily - 7 x weekly - 2 sets - 10 reps - 5 hold - Hooklying Transversus Abdominis Palpation  - 1-2 x daily - 7 x weekly - 2 sets - 10 reps - 5 hold - Clamshell with Resistance  - 2 x daily - 7 x weekly - 2 sets - 10 reps -  Sit to Stand Without Arm Support  - 2 x daily - 7 x weekly - 2 sets - 10 reps - Seated Hamstring Stretch  - 2 x daily - 7 x weekly - 1 sets - 3 reps - 20 hold - Seated Figure 4 Piriformis Stretch  - 3 x daily - 7 x weekly - 1 sets - 3 reps - 20 hold - Step Taps on High Step  - 1 x daily - 7 x weekly - 2 sets - 10  reps  ASSESSMENT:  CLINICAL IMPRESSION: First time follow-up after evaluation.  Pt has been consistent with walking and HEP. PT added to HEP for continued strength.  She will be traveling to [place] and PT educated pt regarding body mechanics and safe lifting. She demonstrates good alignment and control with exercises.  Patient will benefit from skilled PT to address the below impairments and improve overall function.   OBJECTIVE IMPAIRMENTS: decreased activity tolerance, decreased endurance, decreased ROM, decreased strength, hypomobility, increased fascial restrictions, increased muscle spasms, impaired flexibility, improper body mechanics, postural dysfunction, and pain.   ACTIVITY LIMITATIONS: carrying, lifting, squatting, dressing, and exercise   PARTICIPATION LIMITATIONS: community activity  PERSONAL FACTORS: Time since onset of injury/illness/exacerbation and 1 comorbidity: s/p lumbar fusion are also affecting patient's functional outcome.   REHAB POTENTIAL: Good  CLINICAL DECISION MAKING: Stable/uncomplicated  EVALUATION COMPLEXITY: Low   GOALS: Goals reviewed with patient? Yes  SHORT TERM GOALS: Target date: 01/29/2024    Be independent in initial HEP Baseline: Goal status: INITIAL  2.  Verbalize and demonstrate body mechanics mechanics modifications for lumbar protection Baseline: initial education today (01/07/24) Goal status: in progress   3.  Verbalize safe Barre modifications to allow for return to class when cleared by MD Baseline:  Goal status: INITIAL    LONG TERM GOALS: Target date: 02/26/2024    Be independent in advanced HEP Baseline:  Goal status: INITIAL  2.  Improve Lt hip flexibility to put on socks/shoes without difficulty  Baseline:  Goal status: INITIAL  3.  Perform stand to sit transition with good eccentric control due to improved functional quad and glute strength  Baseline:  Goal status: INITIAL  4.  Begin golf activities  including chipping and putting with proper mechanics  Baseline:  Goal status: INITIAL    PLAN:  PT FREQUENCY: 2x/week  PT DURATION: 8 weeks  PLANNED INTERVENTIONS: 97110-Therapeutic exercises, 97530- Therapeutic activity, 97112- Neuromuscular re-education, 97535- Self Care, 02859- Manual therapy, [id number]- Gait training, [phone]- Aquatic Therapy, [phone] (1-2 muscles), 20561 (3+ muscles)- Dry Needling, Patient/Family education, Balance training, Taping, Scar mobilization, Vestibular training, Cryotherapy, and Moist heat.  PLAN FOR NEXT SESSION: see how trip went, add to HEP as tolerated for functional and core strength.     Burnard Joy, PT 01/07/24 8:53 AM   [facility] [address] [place], [place] [address] Phone # [phone] Fax [phone]

## 2024-01-28 DIAGNOSIS — D224 Melanocytic nevi of scalp and neck: Secondary | ICD-10-CM | POA: Diagnosis not present

## 2024-01-28 DIAGNOSIS — L570 Actinic keratosis: Secondary | ICD-10-CM | POA: Diagnosis not present

## 2024-01-28 DIAGNOSIS — L438 Other lichen planus: Secondary | ICD-10-CM | POA: Diagnosis not present

## 2024-01-28 DIAGNOSIS — L821 Other seborrheic keratosis: Secondary | ICD-10-CM | POA: Diagnosis not present

## 2024-01-28 DIAGNOSIS — L918 Other hypertrophic disorders of the skin: Secondary | ICD-10-CM | POA: Diagnosis not present

## 2024-01-28 DIAGNOSIS — D225 Melanocytic nevi of trunk: Secondary | ICD-10-CM | POA: Diagnosis not present

## 2024-01-28 DIAGNOSIS — D692 Other nonthrombocytopenic purpura: Secondary | ICD-10-CM | POA: Diagnosis not present

## 2024-01-28 DIAGNOSIS — L814 Other melanin hyperpigmentation: Secondary | ICD-10-CM | POA: Diagnosis not present

## 2024-01-29 ENCOUNTER — Ambulatory Visit: Attending: Neurosurgery

## 2024-01-29 DIAGNOSIS — M25652 Stiffness of left hip, not elsewhere classified: Secondary | ICD-10-CM | POA: Insufficient documentation

## 2024-01-29 DIAGNOSIS — M6281 Muscle weakness (generalized): Secondary | ICD-10-CM | POA: Diagnosis not present

## 2024-01-29 NOTE — Therapy (Signed)
OUTPATIENT PHYSICAL THERAPY TREATMENT   Patient Name: Dorothy [name] MRN: [id number] DOB:01-19-1956, 68 y.o., female Today's Date: 01/29/2024  END OF SESSION:  PT End of Session - 01/29/24 1522     Visit Number 3    Date for Recertification  02/26/24    Authorization Type Cohere: 8 visits 9/17-11/12/25    Authorization - Visit Number 3    Authorization - Number of Visits 8    Progress Note Due on Visit 10    PT Start Time 1446    PT Stop Time 1521    PT Time Calculation (min) 35 min    Activity Tolerance Patient tolerated treatment well    Behavior During Therapy [facility] for tasks assessed/performed            Past Medical History:  Diagnosis Date   Complication of anesthesia    PONV (postoperative nausea and vomiting)    Past Surgical History:  Procedure Laterality Date   CESAREAN SECTION  04/16/1992   LASIK  2005   Patient Active Problem List   Diagnosis Date Noted   Spinal stenosis at L4-L5 level 10/30/2023   Spinal stenosis, lumbar region, with neurogenic claudication 07/23/2023   Pupillary size inequality 01/29/2023   Patellofemoral arthralgia of right knee 07/07/2021   Tendinitis of right triceps 04/25/2020   Cervicogenic headache 04/29/2018   Nonallopathic lesion of rib cage 02/26/2018   Achilles tendinosis of right lower extremity 09/10/2017   Trigger point of left shoulder region 04/11/2016   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 05/12/2015   Achilles tendinosis 03/14/2015   Trapezius muscle spasm 03/23/2014   Loss of transverse plantar arch 09/08/2013   Nonallopathic lesion of sacral region 07/20/2013   Greater trochanteric bursitis of right hip 06/24/2013   Piriformis syndrome of right side 05/25/2013   Nonallopathic lesion of cervical region 05/11/2013   Sacroiliac joint dysfunction of right side 04/27/2013   Leg length discrepancy 04/27/2013   Nonallopathic lesion of lumbosacral region 04/27/2013   Nonallopathic lesion of thoracic  region 04/27/2013   BACK PAIN, LUMBAR 09/30/2009    PCP: Aisha Harvey, MD  REFERRING PROVIDER: Onetha Kuba, MD  REFERRING DIAG: M43.17 (ICD-10-CM) - Spondylolisthesis, lumbosacral region   Rationale for Evaluation and Treatment: Rehabilitation  THERAPY DIAG:  Muscle weakness (generalized)  Stiffness of left hip, not elsewhere classified  ONSET DATE: surgery 10/2023, Rt radiculopathy 3 year history  SUBJECTIVE:  SUBJECTIVE STATEMENT: I traveled and kept up with my exercises.  My back is sore but just from doing too much.    PERTINENT HISTORY:  PLIF L4/5 10/30/23  PAIN: 01/29/24 Are you having pain? Yes: NPRS scale: low back around incision  Pain location: 1/10 Pain description: discomfort Aggravating factors: doing too much  Relieving factors: rest   PRECAUTIONS: Other: post op PLIF 10/30/23  RED FLAGS: None   WEIGHT BEARING RESTRICTIONS: No  FALLS:  Has patient fallen in last 6 months? No  LIVING ENVIRONMENT: Lives with: lives alone Lives in: House/apartment Stairs: No Has following equipment at home: None  OCCUPATION: retired   PLOF: Independent with basic ADLs, Vocation/Vocational requirements: retired , and Leisure: golf, barre, pickleball   PATIENT GOALS: return to playing golf, Engineer, site and pickleball  NEXT MD VISIT: 1 month   OBJECTIVE:  Note: Objective measures were completed at Evaluation unless otherwise noted.  PATIENT SURVEYS:  Modified Oswestry: 4% disability   COGNITION: Overall cognitive status: Within functional limits for tasks assessed     SENSATION: WFL   POSTURE: rounded shoulders and forward head  PALPATION: Well healing surgical incision with good mobility   LUMBAR ROM:   Not tested due to post-op restrictions   LOWER EXTREMITY MMT:      5/5 bil knee and ankle, bil hip abduction 4/5, hip flexion 4+/5, extension 4+/5  LOWER EXTREMITY ROM:   Lt hip flexibility limited by 25% vs Rt, difficulty with ER in sitting with putting on shoe/sock   GAIT: Distance walked: 50 Assistive device utilized: None Level of assistance: Complete Independence Comments: WFLs   Reduced eccentric control with stand to sit transition   TREATMENT DATE:   01/29/24:  Seated hamstring stretch 2x20 seconds  Seated: slight lean back with [place] twist 5# (partial range) x20 , shoulder to hip x10 each  Plank on elbows and side plank on elbows Sidelying clam with green band 2x10 Forward T x10 each -tactile cues for alignment  Quadruped: arm, leg, arm/leg x 5 each  Seated figure 4 and hamstring stretch 3x20 seconds    01/07/24:  Body mechanics for lifting related to luggage and air travel. Seated hamstring stretch 2x20 seconds  Sit to stand 5# kettlebell parallel and staggered stance x10 each  Figure 4 2x20 seconds  Pallof press: red band with mini squat 2x10 bil Step taps with Farmer's carry 5# x 20 each side  Sidelying clam with green band 2x10 Forward T x10 each                                                                                                                                 PATIENT EDUCATION:  Education details: Access Code: [email or web address],  Person educated: Patient Education method: Explanation, Demonstration, and Handouts Education comprehension: verbalized understanding and returned demonstration  HOME EXERCISE PROGRAM: Access Code: [email or web address] URL: [email or web address] Date: 01/01/2024 Prepared by: Burnard  Exercises - Supine Hip Adduction Isometric with  Ball  - 1-2 x daily - 7 x weekly - 2 sets - 10 reps - 5 hold - Hooklying Transversus Abdominis Palpation  - 1-2 x daily - 7 x weekly - 2 sets - 10 reps - 5 hold - Clamshell with Resistance  - 2 x daily - 7 x weekly - 2 sets - 10 reps - Sit to Stand Without  Arm Support  - 2 x daily - 7 x weekly - 2 sets - 10 reps - Seated Hamstring Stretch  - 2 x daily - 7 x weekly - 1 sets - 3 reps - 20 hold - Seated Figure 4 Piriformis Stretch  - 3 x daily - 7 x weekly - 1 sets - 3 reps - 20 hold - Step Taps on High Step  - 1 x daily - 7 x weekly - 2 sets - 10 reps  ASSESSMENT:  CLINICAL IMPRESSION: Pt was traveling and did very well with her trip and stayed consistent with her exercises.  She did some putting the other day without limitation.  PT added to HEP for neutral core strength to advance her endurance and core activation.  PT provided verbal and tactile cueing for form.  Discussed easing in to exercises and performing activity to tolerance as she builds strength.  Pt will see MD next week to determine if she is allowed to return to golf and pickleball.   Patient will benefit from skilled PT to address the below impairments and improve overall function.   OBJECTIVE IMPAIRMENTS: decreased activity tolerance, decreased endurance, decreased ROM, decreased strength, hypomobility, increased fascial restrictions, increased muscle spasms, impaired flexibility, improper body mechanics, postural dysfunction, and pain.   ACTIVITY LIMITATIONS: carrying, lifting, squatting, dressing, and exercise   PARTICIPATION LIMITATIONS: community activity  PERSONAL FACTORS: Time since onset of injury/illness/exacerbation and 1 comorbidity: s/p lumbar fusion are also affecting patient's functional outcome.   REHAB POTENTIAL: Good  CLINICAL DECISION MAKING: Stable/uncomplicated  EVALUATION COMPLEXITY: Low   GOALS: Goals reviewed with patient? Yes  SHORT TERM GOALS: Target date: 01/29/2024    Be independent in initial HEP Baseline: Goal status: INITIAL  2.  Verbalize and demonstrate body mechanics mechanics modifications for lumbar protection Baseline: initial education today (01/07/24) Goal status: in progress   3.  Verbalize safe Barre modifications to allow for  return to class when cleared by MD Baseline:  Goal status: INITIAL    LONG TERM GOALS: Target date: 02/26/2024    Be independent in advanced HEP Baseline:  Goal status: INITIAL  2.  Improve Lt hip flexibility to put on socks/shoes without difficulty  Baseline:  Goal status: INITIAL  3.  Perform stand to sit transition with good eccentric control due to improved functional quad and glute strength  Baseline:  Goal status: INITIAL  4.  Begin golf activities including chipping and putting with proper mechanics  Baseline:  Goal status: INITIAL    PLAN:  PT FREQUENCY: 2x/week  PT DURATION: 8 weeks  PLANNED INTERVENTIONS: 97110-Therapeutic exercises, 97530- Therapeutic activity, 97112- Neuromuscular re-education, 97535- Self Care, 02859- Manual therapy, [id number]- Gait training, [phone]- Aquatic Therapy, [phone] (1-2 muscles), [id number] (3+ muscles)- Dry Needling, Patient/Family education, Balance training, Taping, Scar mobilization, Vestibular training, Cryotherapy, and Moist heat.  PLAN FOR NEXT SESSION: add to HEP as tolerated for functional and core strength.  See what MD says.    Burnard Joy, PT 01/29/24 3:27 PM   [facility] [address], [address] [place], [place] [address] Phone # [phone] Fax  [phone]   

## 2024-02-04 DIAGNOSIS — M4317 Spondylolisthesis, lumbosacral region: Secondary | ICD-10-CM | POA: Diagnosis not present

## 2024-02-05 ENCOUNTER — Ambulatory Visit

## 2024-02-05 DIAGNOSIS — M6281 Muscle weakness (generalized): Secondary | ICD-10-CM | POA: Diagnosis not present

## 2024-02-05 DIAGNOSIS — M25652 Stiffness of left hip, not elsewhere classified: Secondary | ICD-10-CM

## 2024-02-05 NOTE — Therapy (Signed)
 OUTPATIENT PHYSICAL THERAPY TREATMENT   Patient Name: Dorothy [name] MRN: [id number] DOB:09/13/1955, 68 y.o., female Today's Date: 02/05/2024  END OF SESSION:  PT End of Session - 02/05/24 1526     Visit Number 4    Date for Recertification  02/26/24    Authorization Type Cohere: 8 visits 9/17-11/12/25    Authorization - Visit Number 4    Authorization - Number of Visits 8    Progress Note Due on Visit 10    PT Start Time 1447    PT Stop Time 1510    PT Time Calculation (min) 23 min    Activity Tolerance Patient tolerated treatment well    Behavior During Therapy [facility] for tasks assessed/performed             Past Medical History:  Diagnosis Date   Complication of anesthesia    PONV (postoperative nausea and vomiting)    Past Surgical History:  Procedure Laterality Date   CESAREAN SECTION  04/16/1992   LASIK  2005   Patient Active Problem List   Diagnosis Date Noted   Spinal stenosis at L4-L5 level 10/30/2023   Spinal stenosis, lumbar region, with neurogenic claudication 07/23/2023   Pupillary size inequality 01/29/2023   Patellofemoral arthralgia of right knee 07/07/2021   Tendinitis of right triceps 04/25/2020   Cervicogenic headache 04/29/2018   Nonallopathic lesion of rib cage 02/26/2018   Achilles tendinosis of right lower extremity 09/10/2017   Trigger point of left shoulder region 04/11/2016   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 05/12/2015   Achilles tendinosis 03/14/2015   Trapezius muscle spasm 03/23/2014   Loss of transverse plantar arch 09/08/2013   Nonallopathic lesion of sacral region 07/20/2013   Greater trochanteric bursitis of right hip 06/24/2013   Piriformis syndrome of right side 05/25/2013   Nonallopathic lesion of cervical region 05/11/2013   Sacroiliac joint dysfunction of right side 04/27/2013   Leg length discrepancy 04/27/2013   Nonallopathic lesion of lumbosacral region 04/27/2013   Nonallopathic lesion of  thoracic region 04/27/2013   BACK PAIN, LUMBAR 09/30/2009    PCP: Aisha Harvey, MD  REFERRING PROVIDER: Onetha Kuba, MD  REFERRING DIAG: M43.17 (ICD-10-CM) - Spondylolisthesis, lumbosacral region   Rationale for Evaluation and Treatment: Rehabilitation  THERAPY DIAG:  Muscle weakness (generalized)  Stiffness of left hip, not elsewhere classified  ONSET DATE: surgery 10/2023, Rt radiculopathy 3 year history  SUBJECTIVE:  SUBJECTIVE STATEMENT: I saw the MD and he said I am 70% healed.  I can start back at [facility] with modifications and gentle golf and pickle ball.  Putting, chipping and partial range with tee elevated.    PERTINENT HISTORY:  PLIF L4/5 10/30/23  PAIN: 02/05/24 Are you having pain? Yes: NPRS scale: low back around incision  Pain location: 1/10 Pain description: discomfort Aggravating factors: doing too much  Relieving factors: rest   PRECAUTIONS: Other: post op PLIF 10/30/23  RED FLAGS: None   WEIGHT BEARING RESTRICTIONS: No  FALLS:  Has patient fallen in last 6 months? No  LIVING ENVIRONMENT: Lives with: lives alone Lives in: House/apartment Stairs: No Has following equipment at home: None  OCCUPATION: retired   PLOF: Independent with basic ADLs, Vocation/Vocational requirements: retired , and Leisure: golf, barre, pickleball   PATIENT GOALS: return to playing golf, Engineer, site and pickleball  NEXT MD VISIT: 1 month   OBJECTIVE:  Note: Objective measures were completed at Evaluation unless otherwise noted.  PATIENT SURVEYS:  Modified Oswestry: 4% disability   COGNITION: Overall cognitive status: Within functional limits for tasks assessed     SENSATION: WFL   POSTURE: rounded shoulders and forward head  PALPATION: Well healing surgical incision with  good mobility   LUMBAR ROM:   Not tested due to post-op restrictions   LOWER EXTREMITY MMT:     5/5 bil knee and ankle, bil hip abduction 4/5, hip flexion 4+/5, extension 4+/5  LOWER EXTREMITY ROM:   Lt hip flexibility limited by 25% vs Rt, difficulty with ER in sitting with putting on shoe/sock   GAIT: Distance walked: 50 Assistive device utilized: None Level of assistance: Complete Independence Comments: WFLs   Reduced eccentric control with stand to sit transition   TREATMENT DATE:  02/05/24:  Pt saw MD this week and he says she is 70% healed.  He has allowed her to return to barre, gentle pickle ball and golf.   Session spent discussing barre modifications, running/movement for pickleball and rotation and weight shift for partial golf swing.  Tactile and verbal cues for specific situations associated with these sports.   Practiced plank on elbows on knees and trial of plank on elbows on toes Pt will try a barre class before next session and we will work on any modifications next session.  01/29/24:  Seated hamstring stretch 2x20 seconds  Seated: slight lean back with [place] twist 5# (partial range) x20 , shoulder to hip x10 each  Plank on elbows and side plank on elbows Sidelying clam with green band 2x10 Forward T x10 each -tactile cues for alignment  Quadruped: arm, leg, arm/leg x 5 each  Seated figure 4 and hamstring stretch 3x20 seconds    01/07/24:  Body mechanics for lifting related to luggage and air travel. Seated hamstring stretch 2x20 seconds  Sit to stand 5# kettlebell parallel and staggered stance x10 each  Figure 4 2x20 seconds  Pallof press: red band with mini squat 2x10 bil Step taps with Farmer's carry 5# x 20 each side  Sidelying clam with green band 2x10 Forward T x10 each  PATIENT EDUCATION:  Education details: Access  Code: [email or web address] [phone],  Person educated: Patient Education method: Explanation, Demonstration, and Handouts Education comprehension: verbalized understanding and returned demonstration  HOME EXERCISE PROGRAM: Access Code: [email or web address] URL: [email or web address] Date: 01/01/2024 Prepared by: Burnard  Exercises - Supine Hip Adduction Isometric with Ball  - 1-2 x daily - 7 x weekly - 2 sets - 10 reps - 5 hold - Hooklying Transversus Abdominis Palpation  - 1-2 x daily - 7 x weekly - 2 sets - 10 reps - 5 hold - Clamshell with Resistance  - 2 x daily - 7 x weekly - 2 sets - 10 reps - Sit to Stand Without Arm Support  - 2 x daily - 7 x weekly - 2 sets - 10 reps - Seated Hamstring Stretch  - 2 x daily - 7 x weekly - 1 sets - 3 reps - 20 hold - Seated Figure 4 Piriformis Stretch  - 3 x daily - 7 x weekly - 1 sets - 3 reps - 20 hold - Step Taps on High Step  - 1 x daily - 7 x weekly - 2 sets - 10 reps  ASSESSMENT:  CLINICAL IMPRESSION: Pt is making excellent progress.  She continues to walk and do her HEP with good form.  Session spent today discussing modifications with barre activities, form and how to practice pickle ball and golf in a safe manner.  Pt will ease into these activities per MD recommendations and will return to PT after she has taken a barre class.  She is now able to put on shoes/socks without difficulty due to improved flexibility.  Patient will benefit from skilled PT to address the below impairments and improve overall function.   OBJECTIVE IMPAIRMENTS: decreased activity tolerance, decreased endurance, decreased ROM, decreased strength, hypomobility, increased fascial restrictions, increased muscle spasms, impaired flexibility, improper body mechanics, postural dysfunction, and pain.   ACTIVITY LIMITATIONS: carrying, lifting, squatting, dressing, and exercise   PARTICIPATION LIMITATIONS: community activity  PERSONAL FACTORS: Time since onset of injury/illness/exacerbation  and 1 comorbidity: s/p lumbar fusion are also affecting patient's functional outcome.   REHAB POTENTIAL: Good  CLINICAL DECISION MAKING: Stable/uncomplicated  EVALUATION COMPLEXITY: Low   GOALS: Goals reviewed with patient? Yes  SHORT TERM GOALS: Target date: 01/29/2024    Be independent in initial HEP Baseline: Goal status: INITIAL  2.  Verbalize and demonstrate body mechanics mechanics modifications for lumbar protection Baseline: has received education for daily tasks and exercise class (02/05/24) Goal status: in progress   3.  Verbalize safe Barre modifications to allow for return to class when cleared by MD Baseline:  Goal status: INITIAL    LONG TERM GOALS: Target date: 02/26/2024    Be independent in advanced HEP Baseline:  Goal status: INITIAL  2.  Improve Lt hip flexibility to put on socks/shoes without difficulty  Baseline: able to do this (02/05/24) Goal status: MET  3.  Perform stand to sit transition with good eccentric control due to improved functional quad and glute strength  Baseline:  Goal status: INITIAL  4.  Begin golf activities including chipping and putting with proper mechanics  Baseline:  Goal status: INITIAL    PLAN:  PT FREQUENCY: 2x/week  PT DURATION: 8 weeks  PLANNED INTERVENTIONS: 97110-Therapeutic exercises, 97530- Therapeutic activity, 97112- Neuromuscular re-education, 97535- Self Care, 02859- Manual therapy, [id number]- Gait training, [phone]- Aquatic Therapy, [phone] (1-2 muscles), 20561 (3+ muscles)- Dry Needling, Patient/Family education, Balance training, Taping, Scar mobilization, Vestibular training, Cryotherapy, and Moist heat.  PLAN FOR NEXT SESSION: Pt will return after taking a barre class and easing back into golf and pickle ball    Burnard Joy, PT 02/05/24 3:32 PM   [facility] [address], [address] [place], [place] [address] Phone # [phone] Fax [phone]

## 2024-02-12 ENCOUNTER — Encounter

## 2024-02-19 ENCOUNTER — Ambulatory Visit: Attending: Neurosurgery

## 2024-02-19 DIAGNOSIS — M25652 Stiffness of left hip, not elsewhere classified: Secondary | ICD-10-CM | POA: Insufficient documentation

## 2024-02-19 DIAGNOSIS — M6281 Muscle weakness (generalized): Secondary | ICD-10-CM | POA: Diagnosis not present

## 2024-02-19 NOTE — Therapy (Signed)
 OUTPATIENT PHYSICAL THERAPY TREATMENT   Patient Name: Dorothy [name] MRN: [id number] DOB:[birth year], 68 y.o., female Today's Date: 02/19/2024  END OF SESSION:  PT End of Session - 02/19/24 1512     Visit Number 5    PT Start Time 1451    PT Stop Time 1516    PT Time Calculation (min) 25 min    Activity Tolerance Patient tolerated treatment well    Behavior During Therapy [facility] for tasks assessed/performed              Past Medical History:  Diagnosis Date   Complication of anesthesia    PONV (postoperative nausea and vomiting)    Past Surgical History:  Procedure Laterality Date   CESAREAN SECTION  04/16/1992   LASIK  2005   Patient Active Problem List   Diagnosis Date Noted   Spinal stenosis at L4-L5 level 10/30/2023   Spinal stenosis, lumbar region, with neurogenic claudication 07/23/2023   Pupillary size inequality 01/29/2023   Patellofemoral arthralgia of right knee 07/07/2021   Tendinitis of right triceps 04/25/2020   Cervicogenic headache 04/29/2018   Nonallopathic lesion of rib cage 02/26/2018   Achilles tendinosis of right lower extremity 09/10/2017   Trigger point of left shoulder region 04/11/2016   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 05/12/2015   Achilles tendinosis 03/14/2015   Trapezius muscle spasm 03/23/2014   Loss of transverse plantar arch 09/08/2013   Nonallopathic lesion of sacral region 07/20/2013   Greater trochanteric bursitis of right hip 06/24/2013   Piriformis syndrome of right side 05/25/2013   Nonallopathic lesion of cervical region 05/11/2013   Sacroiliac joint dysfunction of right side 04/27/2013   Leg length discrepancy 04/27/2013   Nonallopathic lesion of lumbosacral region 04/27/2013   Nonallopathic lesion of thoracic region 04/27/2013   BACK PAIN, LUMBAR 09/30/2009    PCP: Aisha Harvey, MD  REFERRING PROVIDER: Onetha Kuba, MD  REFERRING DIAG: M43.17 (ICD-10-CM) - Spondylolisthesis, lumbosacral region    Rationale for Evaluation and Treatment: Rehabilitation  THERAPY DIAG:  Muscle weakness (generalized)  Stiffness of left hip, not elsewhere classified  ONSET DATE: surgery 10/2023, Rt radiculopathy 3 year history  SUBJECTIVE:                                                                                                                                                                                           SUBJECTIVE STATEMENT: I went to a barre class and did a lot of modifications.    PERTINENT HISTORY:  PLIF L4/5 10/30/23  PAIN: 02/05/24 Are you having pain? Yes: NPRS scale: low back around incision  Pain location: 1/10  Pain description: discomfort Aggravating factors: doing too much  Relieving factors: rest   PRECAUTIONS: Other: post op PLIF 10/30/23  RED FLAGS: None   WEIGHT BEARING RESTRICTIONS: No  FALLS:  Has patient fallen in last 6 months? No  LIVING ENVIRONMENT: Lives with: lives alone Lives in: House/apartment Stairs: No Has following equipment at home: None  OCCUPATION: retired   PLOF: Independent with basic ADLs, Vocation/Vocational requirements: retired , and Leisure: golf, barre, pickleball   PATIENT GOALS: return to playing golf, Engineer, Site and pickleball  NEXT MD VISIT: 1 month   OBJECTIVE:  Note: Objective measures were completed at Evaluation unless otherwise noted.  PATIENT SURVEYS:  Modified Oswestry: 4% disability   COGNITION: Overall cognitive status: Within functional limits for tasks assessed     SENSATION: WFL   POSTURE: rounded shoulders and forward head  PALPATION: Well healing surgical incision with good mobility   LUMBAR ROM:   Not tested due to post-op restrictions   LOWER EXTREMITY MMT:     5/5 bil knee and ankle, bil hip abduction 4/5, hip flexion 4+/5, extension 4+/5  LOWER EXTREMITY ROM:   Lt hip flexibility limited by 25% vs Rt, difficulty with ER in sitting with putting on shoe/sock   GAIT: Distance  walked: 50 Assistive device utilized: None Level of assistance: Complete Independence Comments: WFLs   Reduced eccentric control with stand to sit transition   TREATMENT DATE:  02/19/24:  Review of all barre exercises that she performed in her most recent class and demo/verbal cues on how to modify Including core, hip and UE exercises   02/05/24:  Pt saw MD this week and he says she is 70% healed.  He has allowed her to return to barre, gentle pickle ball and golf.   Session spent discussing barre modifications, running/movement for pickleball and rotation and weight shift for partial golf swing.  Tactile and verbal cues for specific situations associated with these sports.   Practiced plank on elbows on knees and trial of plank on elbows on toes Pt will try a barre class before next session and we will work on any modifications next session.  01/29/24:  Seated hamstring stretch 2x20 seconds  Seated: slight lean back with Russian twist 5# (partial range) x20 , shoulder to hip x10 each  Plank on elbows and side plank on elbows Sidelying clam with green band 2x10 Forward T x10 each -tactile cues for alignment  Quadruped: arm, leg, arm/leg x 5 each  Seated figure 4 and hamstring stretch 3x20 seconds                                                                                                                                   PATIENT EDUCATION:  Education details: Access Code: [email or web address],  Person educated: Patient Education method: Explanation, Demonstration, and Handouts Education comprehension: verbalized understanding and returned demonstration  HOME EXERCISE PROGRAM: Access Code: [email or web address] URL: [email or web address] Date: 01/01/2024 Prepared by:  Medora Roorda  Exercises - Supine Hip Adduction Isometric with Ball  - 1-2 x daily - 7 x weekly - 2 sets - 10 reps - 5 hold - Hooklying Transversus Abdominis Palpation  - 1-2 x daily - 7 x weekly - 2 sets - 10 reps - 5 hold -  Clamshell with Resistance  - 2 x daily - 7 x weekly - 2 sets - 10 reps - Sit to Stand Without Arm Support  - 2 x daily - 7 x weekly - 2 sets - 10 reps - Seated Hamstring Stretch  - 2 x daily - 7 x weekly - 1 sets - 3 reps - 20 hold - Seated Figure 4 Piriformis Stretch  - 3 x daily - 7 x weekly - 1 sets - 3 reps - 20 hold - Step Taps on High Step  - 1 x daily - 7 x weekly - 2 sets - 10 reps  ASSESSMENT:  CLINICAL IMPRESSION: Pt is ready to D/C to HEP.  Session spent reviewing all barre class exercises with demo and verbal instructions of how to modify.  Basic principal with all classes and movements is to maintain good core activation and neutral spine.  Pt verbalized and demonstrated all aspects correctly.  She will D/C to barre and HEP.    OBJECTIVE IMPAIRMENTS: decreased activity tolerance, decreased endurance, decreased ROM, decreased strength, hypomobility, increased fascial restrictions, increased muscle spasms, impaired flexibility, improper body mechanics, postural dysfunction, and pain.   ACTIVITY LIMITATIONS: carrying, lifting, squatting, dressing, and exercise   PARTICIPATION LIMITATIONS: community activity  PERSONAL FACTORS: Time since onset of injury/illness/exacerbation and 1 comorbidity: s/p lumbar fusion are also affecting patient's functional outcome.   REHAB POTENTIAL: Good  CLINICAL DECISION MAKING: Stable/uncomplicated  EVALUATION COMPLEXITY: Low   GOALS: Goals reviewed with patient? Yes  SHORT TERM GOALS: Target date: 01/29/2024    Be independent in initial HEP Baseline: Goal status: MET  2.  Verbalize and demonstrate body mechanics mechanics modifications for lumbar protection Baseline: has received education for daily tasks and exercise class (02/05/24) Goal status: MET  3.  Verbalize safe Barre modifications to allow for return to class when cleared by MD Baseline:  Goal status: MET    LONG TERM GOALS: Target date: 02/26/2024    Be independent  in advanced HEP Baseline:  Goal status: INITIAL  2.  Improve Lt hip flexibility to put on socks/shoes without difficulty  Baseline: able to do this (02/05/24) Goal status: MET  3.  Perform stand to sit transition with good eccentric control due to improved functional quad and glute strength  Baseline:  Goal status:MET  4.  Begin golf activities including chipping and putting with proper mechanics  Baseline: putting only (02/19/24) Goal status: partially met    PLAN: PHYSICAL THERAPY DISCHARGE SUMMARY  Visits from Start of Care: 5  Current functional level related to goals / functional outcomes: No functional deficits remain at this time.    Remaining deficits: Pt has not returned to playing pickle ball or golf.  She has done preliminary movements to get ready and understands safe progression for return.    Education / Equipment: Giacomo modifications, estate manager/land agent, HEP   Patient agrees to discharge. Patient goals were met. Patient is being discharged due to meeting the stated rehab goals.     Burnard Joy, PT 02/19/24 3:18 PM   [facility] [address] [place], [place] [address] Phone # [phone] Fax [phone]

## 2024-06-25 ENCOUNTER — Ambulatory Visit: Admitting: Family Medicine
